# Patient Record
Sex: Male | Born: 1960
Health system: Southern US, Community
[De-identification: ages and names within clinical notes are randomized; demographics above are authoritative.]

## PROBLEM LIST (undated history)

## (undated) DIAGNOSIS — I10 Essential (primary) hypertension: Secondary | ICD-10-CM

## (undated) DIAGNOSIS — E079 Disorder of thyroid, unspecified: Secondary | ICD-10-CM

## (undated) DIAGNOSIS — E039 Hypothyroidism, unspecified: Secondary | ICD-10-CM

## (undated) DIAGNOSIS — G4730 Sleep apnea, unspecified: Secondary | ICD-10-CM

## (undated) DIAGNOSIS — D649 Anemia, unspecified: Secondary | ICD-10-CM

---

## 2004-07-18 DIAGNOSIS — G4733 Obstructive sleep apnea (adult) (pediatric): Secondary | ICD-10-CM | POA: Insufficient documentation

## 2005-04-29 ENCOUNTER — Ambulatory Visit (HOSPITAL_COMMUNITY): Admission: RE | Admit: 2005-04-29 | Discharge: 2005-04-29 | Payer: Self-pay | Admitting: Family Medicine

## 2006-04-14 ENCOUNTER — Ambulatory Visit (HOSPITAL_COMMUNITY): Admission: RE | Admit: 2006-04-14 | Discharge: 2006-04-14 | Payer: Self-pay | Admitting: Family Medicine

## 2006-05-08 ENCOUNTER — Ambulatory Visit: Payer: Self-pay | Admitting: Orthopedic Surgery

## 2006-12-26 ENCOUNTER — Ambulatory Visit (HOSPITAL_COMMUNITY): Admission: RE | Admit: 2006-12-26 | Discharge: 2006-12-26 | Payer: Self-pay | Admitting: Internal Medicine

## 2010-08-08 ENCOUNTER — Encounter: Payer: Self-pay | Admitting: Family Medicine

## 2010-09-15 ENCOUNTER — Emergency Department (HOSPITAL_COMMUNITY)
Admission: EM | Admit: 2010-09-15 | Discharge: 2010-09-15 | Disposition: A | Discharge status: Home / Self Care | Payer: Self-pay | Attending: Emergency Medicine | Admitting: Emergency Medicine

## 2010-09-15 DIAGNOSIS — S61209A Unspecified open wound of unspecified finger without damage to nail, initial encounter: Secondary | ICD-10-CM | POA: Insufficient documentation

## 2010-09-15 DIAGNOSIS — W278XXA Contact with other nonpowered hand tool, initial encounter: Secondary | ICD-10-CM | POA: Insufficient documentation

## 2010-09-15 DIAGNOSIS — E039 Hypothyroidism, unspecified: Secondary | ICD-10-CM | POA: Insufficient documentation

## 2016-07-29 DIAGNOSIS — E039 Hypothyroidism, unspecified: Secondary | ICD-10-CM | POA: Diagnosis not present

## 2016-07-29 DIAGNOSIS — Z1389 Encounter for screening for other disorder: Secondary | ICD-10-CM | POA: Diagnosis not present

## 2016-07-29 DIAGNOSIS — Z23 Encounter for immunization: Secondary | ICD-10-CM | POA: Diagnosis not present

## 2016-07-29 DIAGNOSIS — G4730 Sleep apnea, unspecified: Secondary | ICD-10-CM | POA: Diagnosis not present

## 2017-07-28 DIAGNOSIS — R2232 Localized swelling, mass and lump, left upper limb: Secondary | ICD-10-CM | POA: Diagnosis not present

## 2017-07-28 DIAGNOSIS — E039 Hypothyroidism, unspecified: Secondary | ICD-10-CM | POA: Diagnosis not present

## 2017-08-17 ENCOUNTER — Ambulatory Visit: Payer: Commercial Managed Care - PPO | Admitting: Orthopaedic Surgery

## 2017-08-17 ENCOUNTER — Encounter: Payer: Self-pay | Admitting: Orthopaedic Surgery

## 2017-08-17 VITALS — BP 136/88 | HR 81 | Temp 97.7°F | Wt 204.0 lb

## 2017-08-17 DIAGNOSIS — M6530 Trigger finger, unspecified finger: Secondary | ICD-10-CM

## 2017-08-17 NOTE — Progress Notes (Signed)
Subjective:    Patient ID: Jesse Roberts, male    DOB: 08/28/1960, 57 y.o.   MRN: [id number]  HPI He has had triggering and locking of his left long finger since November.  He lives in [place] [employer] [employer].  He is "back home" for a brief time.  He has problems with inability to fully flex his finger and he has noticed it will not fully straighten.  He is going back to [place] soon and cannot have any surgery on it now.  He will be back in April and will consider surgery then.  He has no redness, he has more pain at the PIP joint.  He has no trauma.   Review of Systems  Musculoskeletal: Positive for arthralgias.  All other systems reviewed and are negative.  History reviewed. No pertinent past medical history.  History reviewed. No pertinent surgical history.  No current outpatient medications on file prior to visit.   No current facility-administered medications on file prior to visit.     Social History   Socioeconomic History  . Marital status: Married    Spouse name: Not on file  . Number of children: Not on file  . Years of education: Not on file  . Highest education level: Not on file  Social Needs  . Financial resource strain: Not on file  . Food insecurity - worry: Not on file  . Food insecurity - inability: Not on file  . Transportation needs - medical: Not on file  . Transportation needs - non-medical: Not on file  Occupational History  . Not on file  Tobacco Use  . Smoking status: Never Smoker  . Smokeless tobacco: Never Used  Substance and Sexual Activity  . Alcohol use: Yes  . Drug use: No  . Sexual activity: Not on file  Other Topics Concern  . Not on file  Social History Narrative  . Not on file    History reviewed. No pertinent family history.  BP 136/88   Pulse 81   Temp 97.7 F (36.5 C)   Wt 204 lb (92.5 kg)       Objective:   Physical Exam  Constitutional: He is oriented to person, place, and time. He appears well-developed and  well-nourished.  HENT:  Head: Normocephalic and atraumatic.  Eyes: Conjunctivae and EOM are normal. Pupils are equal, round, and reactive to light.  Neck: Normal range of motion. Neck supple.  Cardiovascular: Normal rate, regular rhythm and intact distal pulses.  Pulmonary/Chest: Effort normal.  Abdominal: Soft.  Musculoskeletal: He exhibits tenderness (Left long finger cannot fully flex, if he does it locks up and hurts.  NV intact.  He cannot fully extend it without pain.  Other fingers negative.).  Neurological: He is alert and oriented to person, place, and time. He has normal reflexes. He displays normal reflexes. No cranial nerve deficit. He exhibits normal muscle tone. Coordination normal.  Skin: Skin is warm and dry.  Psychiatric: He has a normal mood and affect. His behavior is normal. Judgment and thought content normal.  Vitals reviewed.         Assessment & Plan:   Encounter Diagnosis  Name Primary?  . Trigger finger, acquired Yes   I have explained what he has and that surgery would be the definite treatment.  He is going back to [place] soon and says he cannot have surgery at this time.  I told him about injection that may or may not help.  He would  like to have the injection.  He will be back in April and will have surgery then if he is still having a problem.  Procedure note: After permission from the patient and prep of the palmar area of the A1 pulley of the left long finger, the area was injected by sterile technique with 1% xylocaine and 1 cc of DepoMedrol 40 tolerated well.  Return in April. Make appointment to see Dr. Romeo Apple for possible surgery.  Call if any problem.  Precautions discussed.   Electronically Signed Darreld Mclean, MD 1/31/20199:28 AM

## 2017-11-12 ENCOUNTER — Encounter (HOSPITAL_COMMUNITY): Payer: Self-pay | Admitting: Emergency Medicine

## 2017-11-12 ENCOUNTER — Emergency Department (HOSPITAL_COMMUNITY): Payer: Commercial Managed Care - PPO

## 2017-11-12 ENCOUNTER — Emergency Department (HOSPITAL_COMMUNITY)
Admission: EM | Admit: 2017-11-12 | Discharge: 2017-11-12 | Disposition: A | Discharge status: Home / Self Care | Payer: Commercial Managed Care - PPO | Attending: Emergency Medicine | Admitting: Emergency Medicine

## 2017-11-12 ENCOUNTER — Other Ambulatory Visit: Payer: Self-pay

## 2017-11-12 DIAGNOSIS — E039 Hypothyroidism, unspecified: Secondary | ICD-10-CM | POA: Insufficient documentation

## 2017-11-12 DIAGNOSIS — Z79899 Other long term (current) drug therapy: Secondary | ICD-10-CM | POA: Diagnosis not present

## 2017-11-12 DIAGNOSIS — L03114 Cellulitis of left upper limb: Secondary | ICD-10-CM | POA: Insufficient documentation

## 2017-11-12 DIAGNOSIS — R21 Rash and other nonspecific skin eruption: Secondary | ICD-10-CM | POA: Diagnosis not present

## 2017-11-12 DIAGNOSIS — S61432A Puncture wound without foreign body of left hand, initial encounter: Secondary | ICD-10-CM | POA: Diagnosis not present

## 2017-11-12 DIAGNOSIS — R2232 Localized swelling, mass and lump, left upper limb: Secondary | ICD-10-CM | POA: Diagnosis present

## 2017-11-12 HISTORY — DX: Disorder of thyroid, unspecified: E07.9

## 2017-11-12 MED ORDER — IBUPROFEN 600 MG PO TABS: 600.0000 mg | ORAL_TABLET | Freq: Four times a day (QID) | ORAL | 0 refills | Status: DC | PRN

## 2017-11-12 MED ORDER — CEPHALEXIN 500 MG PO CAPS: 500.0000 mg | ORAL_CAPSULE | Freq: Four times a day (QID) | ORAL | 0 refills | Status: DC

## 2017-11-12 MED ORDER — CEPHALEXIN 500 MG PO CAPS
500.0000 mg | ORAL_CAPSULE | Freq: Once | ORAL | Status: AC
  Administered 2017-11-12: 500 mg via ORAL
  Filled 2017-11-12: qty 1

## 2017-11-12 NOTE — ED Provider Notes (Signed)
[facility] EMERGENCY DEPARTMENT Provider Note   CSN: [id number] Arrival date & time: 11/12/17  1759     History   Chief Complaint Chief Complaint  Patient presents with  . Hand Injury    HPI Jesse Roberts is a 57 y.o. male.  HPI  Healthy 57 year old male, currently in the military, presents 24 hours after accidentally injuring his left hand.  He was using a cordless drill to build a flower bed, the drill bit slipped off of the surface and went through his left hand, penetrating the skin.  This occurred on the dorsum of the hand over the distal second metacarpal.  There was a small amount of bleeding.  He immediately went in the house and washed it off and applied a triple antibiotic ointment.  This morning he woke up and it was more red and swollen which has become progressively more swollen throughout the day.  Denies fevers, is still able to open and close the hand but with some discomfort.  Symptoms are persistent, mild to moderate, gradually worsening, no associated fevers  Past Medical History:  Diagnosis Date  . Thyroid disease    hypothyroidism    There are no active problems to display for this patient.   History reviewed. No pertinent surgical history.      Home Medications    Prior to Admission medications   Medication Sig Start Date End Date Taking? Authorizing Provider  calcium-vitamin D (OSCAL WITH D) 500-200 MG-UNIT tablet Take 1 tablet by mouth daily.   Yes [provider]  ferrous sulfate 325 (65 FE) MG tablet Take 325 mg by mouth daily with breakfast.   Yes [provider]  levothyroxine (SYNTHROID, LEVOTHROID) 75 MCG tablet Take 75 mcg by mouth daily before breakfast.   Yes [provider]  MAGNESIUM PO Take 1 tablet by mouth daily.   Yes [provider]  OVER THE COUNTER MEDICATION Take 2 capsules by mouth daily.   Yes [provider]  Potassium 99 MG TABS Take 1 tablet by mouth daily.   Yes [provider]  cephALEXin (KEFLEX) 500 MG capsule Take 1 capsule (500 mg total) by mouth 4 (four) times daily. 11/12/17   Eber Hong, MD  ibuprofen (ADVIL,MOTRIN) 600 MG tablet Take 1 tablet (600 mg total) by mouth every 6 (six) hours as needed. 11/12/17   Eber Hong, MD  sildenafil (VIAGRA) 25 MG tablet Take 25 mg by mouth daily as needed for erectile dysfunction.    [provider]    Family History No family history on file.  Social History Social History   Tobacco Use  . Smoking status: Never Smoker  . Smokeless tobacco: Never Used  Substance Use Topics  . Alcohol use: Yes    Comment: socially  . Drug use: No     Allergies   Patient has no known allergies.   Review of Systems Review of Systems  Constitutional: Negative for fever.  Skin: Positive for rash and wound.     Physical Exam Updated Vital Signs BP 137/85 (BP Location: Right Arm)   Pulse (!) 105   Temp 98.3 F (36.8 C) (Oral)   Resp 16   Ht  (1.702 m)   Wt 88.5 kg (195 lb)   SpO2 99%   BMI 30.54 kg/m   Physical Exam  Constitutional: He appears well-developed and well-nourished. No distress.  HENT:  Head: Normocephalic and atraumatic.  Eyes: Conjunctivae are normal. Right eye exhibits no discharge.  Left eye exhibits no discharge. No scleral icterus.  Cardiovascular: Normal rate and regular rhythm.  No murmur heard. Pulmonary/Chest: Effort normal and breath sounds normal.  Musculoskeletal: He exhibits tenderness. He exhibits no edema.  Patient is able to fully open and close the hand, flexion and extension of all fingers without any difficulty, no signs of extensor or flexor tenosynovitis.  Mild tenderness over the dorsum of the hand with mild redness extending to the wrist.  Skin: Skin is warm and dry. He is not diaphoretic.  Mild tenderness over the dorsum of the left hand, there is a small wound to the distal hand over the dorsal surface over the second metacarpal.  Scab in place   Nursing note and vitals reviewed.    ED Treatments / Results  Labs (all labs ordered are listed, but only abnormal results are displayed) Labs Reviewed - No data to display  EKG None  Radiology No results found.  Procedures Procedures (including critical care time)  Medications Ordered in ED Medications  cephALEXin (KEFLEX) capsule 500 mg (has no administration in time range)     Initial Impression / Assessment and Plan / ED Course  I have reviewed the triage vital signs and the nursing notes.  Pertinent labs & imaging results that were available during my care of the patient were reviewed by me and considered in my medical decision making (see chart for details).     X-ray to rule out foreign body or fracture of the bone, patient otherwise appears well, no need for surgery at this time, will proceed with antibiotics, the patient [place], I think this would be reasonable at this time, he is aware of the indications for return and will have access to surgical care as needed.  Keflex, Hand xrays without acute findings Pt informed Stable for d/c  Final Clinical Impressions(s) / ED Diagnoses   Final diagnoses:  Cellulitis of left hand    ED Discharge Orders        Ordered    cephALEXin (KEFLEX) 500 MG capsule  4 times daily     11/12/17 2022    ibuprofen (ADVIL,MOTRIN) 600 MG tablet  Every 6 hours PRN     11/12/17 2022       Eber Hong, MD 11/12/17 2024

## 2017-11-12 NOTE — ED Triage Notes (Signed)
Pt reports accidentally drilling into LT hand yesterday. Pt stated today he noticed increased pain, redness, and swelling.

## 2017-11-12 NOTE — Discharge Instructions (Addendum)
Your testing shows no injury to the bone There is a spreading infection called "cellulitis" which requires antibiotics Keflex 4 times daily for 7 days Emergency department immediately for increasing pain swelling or redness though you will have more swelling for the next 24 hours, it should then start to improve.  Ibuprofen for pain, keep the hand elevated  Keep the hand covered with sterile dressing and antibiotic ointment

## 2017-11-12 NOTE — ED Notes (Signed)
Pt ambulatory to waiting room. Pt verbalized understanding of discharge instructions.   

## 2017-11-13 DIAGNOSIS — Z0000 Encounter for general adult medical examination without abnormal findings: Secondary | ICD-10-CM | POA: Diagnosis not present

## 2017-11-13 DIAGNOSIS — E039 Hypothyroidism, unspecified: Secondary | ICD-10-CM | POA: Diagnosis not present

## 2018-03-28 ENCOUNTER — Ambulatory Visit: Payer: Self-pay | Admitting: Orthopaedic Surgery

## 2018-04-12 DIAGNOSIS — E039 Hypothyroidism, unspecified: Secondary | ICD-10-CM | POA: Diagnosis not present

## 2018-04-12 DIAGNOSIS — Z6831 Body mass index (BMI) 31.0-31.9, adult: Secondary | ICD-10-CM | POA: Diagnosis not present

## 2018-04-18 ENCOUNTER — Ambulatory Visit (INDEPENDENT_AMBULATORY_CARE_PROVIDER_SITE_OTHER): Payer: Commercial Managed Care - PPO | Admitting: Orthopaedic Surgery

## 2018-04-18 ENCOUNTER — Encounter: Payer: Self-pay | Admitting: Orthopaedic Surgery

## 2018-04-18 VITALS — BP 137/100 | HR 91 | Ht 67.0 in | Wt 200.0 lb

## 2018-04-18 DIAGNOSIS — M65332 Trigger finger, left middle finger: Secondary | ICD-10-CM | POA: Diagnosis not present

## 2018-04-18 DIAGNOSIS — M6530 Trigger finger, unspecified finger: Secondary | ICD-10-CM

## 2018-04-18 NOTE — Progress Notes (Signed)
Patient Jesse Roberts, male DOB:12/04/1960, 57 y.o. AVW:[id number]  Chief Complaint  Patient presents with  . Hand Pain    Trigger finger left middle    HPI  Jesse Roberts is a 57 y.o. male who has recurrence of trigger finger left long.  He is in the [employer] and is about to go to [place].  He will be back here in the [place] in January.  He would like to have surgery then.  I will arrange this with Dr. Romeo Apple.   Body mass index is 31.32 kg/m.  ROS  Review of Systems  Musculoskeletal: Positive for arthralgias.  All other systems reviewed and are negative.   All other systems reviewed and are negative.  The following is a summary of the past history medically, past history surgically, known current medicines, social history and family history.  This information is gathered electronically by the computer from prior information and documentation.  I review this each visit and have found including this information at this point in the chart is beneficial and informative.    Past Medical History:  Diagnosis Date  . Thyroid disease    hypothyroidism    History reviewed. No pertinent surgical history.  History reviewed. No pertinent family history.  Social History Social History   Tobacco Use  . Smoking status: Never Smoker  . Smokeless tobacco: Never Used  Substance Use Topics  . Alcohol use: Yes    Comment: socially  . Drug use: No    No Known Allergies  Current Outpatient Medications  Medication Sig Dispense Refill  . calcium-vitamin D (OSCAL WITH D) 500-200 MG-UNIT tablet Take 1 tablet by mouth daily.    . cephALEXin (KEFLEX) 500 MG capsule Take 1 capsule (500 mg total) by mouth 4 (four) times daily. 28 capsule 0  . ferrous sulfate 325 (65 FE) MG tablet Take 325 mg by mouth daily with breakfast.    . ibuprofen (ADVIL,MOTRIN) 600 MG tablet Take 1 tablet (600 mg total) by mouth every 6 (six) hours as needed. 30 tablet 0  . levothyroxine (SYNTHROID, LEVOTHROID) 75 MCG  tablet Take 75 mcg by mouth daily before breakfast.    . MAGNESIUM PO Take 1 tablet by mouth daily.    Marland Kitchen OVER THE COUNTER MEDICATION Take 2 capsules by mouth daily.    . Potassium 99 MG TABS Take 1 tablet by mouth daily.    . sildenafil (VIAGRA) 25 MG tablet Take 25 mg by mouth daily as needed for erectile dysfunction.     No current facility-administered medications for this visit.      Physical Exam  Blood pressure (!) 137/100, pulse 91, height 5\' 7"  (1.702 m), weight 200 lb (90.7 kg).  Constitutional: overall normal hygiene, normal nutrition, well developed, normal grooming, normal body habitus. Assistive device:none  Musculoskeletal: gait and station Limp none, muscle tone and strength are normal, no tremors or atrophy is present.  .  Neurological: coordination overall normal.  Deep tendon reflex/nerve stretch intact.  Sensation normal.  Cranial nerves II-XII intact.   Skin:   Normal overall no scars, lesions, ulcers or rashes. No psoriasis.  Psychiatric: Alert and oriented x 3.  Recent memory intact, remote memory unclear.  Normal mood and affect. Well groomed.  Good eye contact.  Cardiovascular: overall no swelling, no varicosities, no edema bilaterally, normal temperatures of the legs and arms, no clubbing, cyanosis and good capillary refill.  Lymphatic: palpation is normal.  He has triggering of the left long finger.  NV intact. All other systems reviewed and are negative   The patient has been educated about the nature of the problem(s) and counseled on treatment options.  The patient appeared to understand what I have discussed and is in agreement with it.  Encounter Diagnosis  Name Primary?  . Trigger finger, acquired Yes    PLAN Call if any problems.  Precautions discussed.  Continue current medications.   Return to clinic in January 2020 to see Dr. Romeo Apple.   Electronically Signed Darreld Mclean, MD 10/2/20193:06 PM

## 2018-07-25 ENCOUNTER — Encounter: Payer: Self-pay | Admitting: Orthopedic Surgery

## 2018-07-25 ENCOUNTER — Ambulatory Visit (INDEPENDENT_AMBULATORY_CARE_PROVIDER_SITE_OTHER): Payer: 59 | Admitting: Orthopedic Surgery

## 2018-07-25 VITALS — BP 147/93 | HR 78 | Ht 67.0 in | Wt 200.0 lb

## 2018-07-25 DIAGNOSIS — M6530 Trigger finger, unspecified finger: Secondary | ICD-10-CM

## 2018-07-25 NOTE — Progress Notes (Signed)
PREOP CONSULT/REFERRAL INTRA-OFFICE FROM DR Gaylene Brooks   Chief Complaint  Patient presents with  . Hand Problem    trigger finger / discuss surgery left middle     58 year old male presents for evaluation of left long trigger finger possible surgical intervention  He has had some difficulties with his left long finger with catching locking and moderate to severe pain over the A1 pulley for approximately 6 months.  He received a cortisone injection with good relief for 4-1/2 to 5 months.  However, he is interested in having surgery to get permanent relief   Review of Systems  Constitutional: Negative for chills, fever and weight loss.  Respiratory: Negative for shortness of breath.   Cardiovascular: Negative for chest pain.  Neurological: Negative for tingling.  All other systems reviewed and are negative.    Past Medical History:  Diagnosis Date  . Thyroid disease    hypothyroidism    History reviewed. No pertinent surgical history.  Family History  Problem Relation Age of Onset  . Healthy Mother   . COPD Father    Social History   Tobacco Use  . Smoking status: Never Smoker  . Smokeless tobacco: Never Used  Substance Use Topics  . Alcohol use: Yes    Comment: socially  . Drug use: No    No Known Allergies   Current Meds  Medication Sig  . calcium-vitamin D (OSCAL WITH D) 500-200 MG-UNIT tablet Take 1 tablet by mouth daily.  . ferrous sulfate 325 (65 FE) MG tablet Take 325 mg by mouth daily with breakfast.  . ibuprofen (ADVIL,MOTRIN) 600 MG tablet Take 1 tablet (600 mg total) by mouth every 6 (six) hours as needed.  Marland Kitchen levothyroxine (SYNTHROID, LEVOTHROID) 75 MCG tablet Take 75 mcg by mouth daily before breakfast.  . MAGNESIUM PO Take 1 tablet by mouth daily.  Marland Kitchen OVER THE COUNTER MEDICATION Take 2 capsules by mouth daily.  . Potassium 99 MG TABS Take 1 tablet by mouth daily.  . sildenafil (VIAGRA) 25 MG tablet Take 25 mg by mouth daily as needed for erectile  dysfunction.    BP (!) 147/93   Pulse 78   Ht 5\' 7"  (1.702 m)   Wt 200 lb (90.7 kg)   BMI 31.32 kg/m   Physical Exam Constitutional:      Appearance: He is well-developed.  Neurological:     Mental Status: He is alert and oriented to person, place, and time.  Psychiatric:        Behavior: Behavior normal.        Thought Content: Thought content normal.        Judgment: Judgment normal.     Ortho Exam   Left hand normal alignment is noted in the left hand.  There is tenderness over the A1 pulley and adequate and inability to fully flex the long finger no instability is elicited strength is normal in the flexor extensor tendons the skin is intact pulses are good color is normal lymph nodes in the epitrochlear region are normal there are no sensory deficits  The right hand appearance range of motion normal  MEDICAL DECISION SECTION  Dr. Sanjuan Dame note was reviewed his note confirms history given by the patient and previous injection No diagnosis found.   PLAN:   Surgical procedure planned: Left long finger trigger release  The procedure has been fully reviewed with the patient; The risks and benefits of surgery have been discussed and explained and understood. Alternative treatment has also been  reviewed, questions were encouraged and answered. The postoperative plan is also been reviewed.  Nonsurgical treatment as described in the history and physical section was attempted and unsuccessful and the patient has agreed to proceed with surgical intervention to improve their situation.  No orders of the defined types were placed in this encounter.   Fuller Canada, MD 07/25/2018 9:10 AM

## 2018-07-25 NOTE — Patient Instructions (Signed)
Trigger Finger    Trigger finger (stenosing tenosynovitis) is a condition that causes a finger to get stuck in a bent position. Each finger has a tough, cord-like tissue that connects muscle to bone (tendon), and each tendon is surrounded by a tunnel of tissue (tendon sheath). To move your finger, your tendon needs to slide freely through the sheath. Trigger finger happens when the tendon or the sheath thickens, making it difficult to move your finger.  Trigger finger can affect any finger or a thumb. It may affect more than one finger. Mild cases may clear up with rest and medicine. Severe cases require more treatment.  What are the causes?  Trigger finger is caused by a thickened finger tendon or tendon sheath. The cause of this thickening is not known.  What increases the risk?  The following factors may make you more likely to develop this condition:   Doing activities that require a strong grip.   Having rheumatoid arthritis, gout, or diabetes.   Being 40-60 years old.   Being a woman.  What are the signs or symptoms?  Symptoms of this condition include:   Pain when bending or straightening your finger.   Tenderness or swelling where your finger attaches to the palm of your hand.   A lump in the palm of your hand or on the inside of your finger.   Hearing a popping sound when you try to straighten your finger.   Feeling a popping, catching, or locking sensation when you try to straighten your finger.   Being unable to straighten your finger.  How is this diagnosed?  This condition is diagnosed based on your symptoms and a physical exam.  How is this treated?  This condition may be treated by:   Resting your finger and avoiding activities that make symptoms worse.   Wearing a finger splint to keep your finger in a slightly bent position.   Taking NSAIDs to relieve pain and swelling.   Injecting medicine (steroids) into the tendon sheath to reduce swelling and irritation. Injections may need to be  repeated.   Having surgery to open the tendon sheath. This may be done if other treatments do not work and you cannot straighten your finger. You may need physical therapy after surgery.  Follow these instructions at home:     Use moist heat to help reduce pain and swelling as told by your health care provider.   Rest your finger and avoid activities that make pain worse. Return to normal activities as told by your health care provider.   If you have a splint, wear it as told by your health care provider.   Take over-the-counter and prescription medicines only as told by your health care provider.   Keep all follow-up visits as told by your health care provider. This is important.  Contact a health care provider if:   Your symptoms are not improving with home care.  Summary   Trigger finger (stenosing tenosynovitis) causes your finger to get stuck in a bent position, and it can make it difficult and painful to straighten your finger.   This condition develops when a finger tendon or tendon sheath thickens.   Treatment starts with resting, wearing a splint, and taking NSAIDs.   In severe cases, surgery to open the tendon sheath may be needed.  This information is not intended to replace advice given to you by your health care provider. Make sure you discuss any questions you have with your   health care provider.  Document Released: 04/23/2004 Document Revised: 06/14/2016 Document Reviewed: 06/14/2016  Elsevier Interactive Patient Education  2019 [employer]

## 2018-07-26 ENCOUNTER — Telehealth: Payer: Self-pay | Admitting: Radiology

## 2018-07-26 NOTE — Telephone Encounter (Signed)
Called [employer] [facility] [phone] spoke to Destiny B no prior authorization is required for OP Trigger finger release [id number] Ref number for the call is her name and todays date

## 2018-07-30 NOTE — Patient Instructions (Signed)
Jake BatheRalph L Washinton  07/30/2018     @PREFPERIOPPHARMACY @   Your procedure is scheduled on  08/02/2018  Report to [facility] at  1000   A.M.  Call this number if you have problems the morning of surgery:  [phone]   Remember:  Do not eat or drink after midnight.                        Take these medicines the morning of surgery with A SIP OF WATER  Levothyroxine.    Do not wear jewelry, make-up or nail polish.  Do not wear lotions, powders, or perfumes, or deodorant.  Do not shave 48 hours prior to surgery.  Men may shave face and neck.  Do not bring valuables to the hospital.  [facility] is not responsible for any belongings or valuables.  Contacts, dentures or bridgework may not be worn into surgery.  Leave your suitcase in the car.  After surgery it may be brought to your room.  For patients admitted to the hospital, discharge time will be determined by your treatment team.  Patients discharged the day of surgery will not be allowed to drive home.   Name and phone number of your driver:   family Special instructions:  None  Please read over the following fact sheets that you were given. Anesthesia Post-op Instructions and Care and Recovery After Surgery       Incision Care, Adult An incision is a surgical cut that is made through your skin. Most incisions are closed after surgery. Your incision may be closed with stitches (sutures), staples, skin glue, or adhesive strips. You may need to return to your health care provider to have sutures or staples removed. This may occur several days to several weeks after your surgery. The incision needs to be cared for properly to prevent infection. How to care for your incision Incision care   Follow instructions from your health care provider about how to take care of your incision. Make sure you: ? Wash your hands with soap and water before you change the bandage (dressing). If soap and water are not available,  use hand sanitizer. ? Change your dressing as told by your health care provider. ? Leave sutures, skin glue, or adhesive strips in place. These skin closures may need to stay in place for 2 weeks or longer. If adhesive strip edges start to loosen and curl up, you may trim the loose edges. Do not remove adhesive strips completely unless your health care provider tells you to do that.  Check your incision area every day for signs of infection. Check for: ? More redness, swelling, or pain. ? More fluid or blood. ? Warmth. ? Pus or a bad smell.  Ask your health care provider how to clean the incision. This may include: ? Using mild soap and water. ? Using a clean towel to pat the incision dry after cleaning it. ? Applying a cream or ointment. Do this only as told by your health care provider. ? Covering the incision with a clean dressing.  Ask your health care provider when you can leave the incision uncovered.  Do not take baths, swim, or use a hot tub until your health care provider approves. Ask your health care provider if you can take showers. You may only be allowed to take sponge baths for bathing. Medicines  If you were prescribed an antibiotic  medicine, cream, or ointment, take or apply the antibiotic as told by your health care provider. Do not stop taking or applying the antibiotic even if your condition improves.  Take over-the-counter and prescription medicines only as told by your health care provider. General instructions  Limit movement around your incision to improve healing. ? Avoid straining, lifting, or exercise for the first month, or for as long as told by your health care provider. ? Follow instructions from your health care provider about returning to your normal activities. ? Ask your health care provider what activities are safe.  Protect your incision from the sun when you are outside for the first 6 months, or for as long as told by your health care provider.  Apply sunscreen around the scar or cover it up.  Keep all follow-up visits as told by your health care provider. This is important. Contact a health care provider if:  Your have more redness, swelling, or pain around the incision.  You have more fluid or blood coming from the incision.  Your incision feels warm to the touch.  You have pus or a bad smell coming from the incision.  You have a fever or shaking chills.  You are nauseous or you vomit.  You are dizzy.  Your sutures or staples come undone. Get help right away if:  You have a red streak coming from your incision.  Your incision bleeds through the dressing and the bleeding does not stop with gentle pressure.  The edges of your incision open up and separate.  You have severe pain.  You have a rash.  You are confused.  You faint.  You have trouble breathing and a fast heartbeat. This information is not intended to replace advice given to you by your health care provider. Make sure you discuss any questions you have with your health care provider. Document Released: 01/21/2005 Document Revised: 03/11/2016 Document Reviewed: 01/20/2016 Elsevier Interactive Patient Education  2019 [employer] Inc[employer]  Trigger Finger  Trigger finger (stenosing tenosynovitis) is a condition that causes a finger to get stuck in a bent position. Each finger has a tough, cord-like tissue that connects muscle to bone (tendon), and each tendon is surrounded by a tunnel of tissue (tendon sheath). To move your finger, your tendon needs to slide freely through the sheath. Trigger finger happens when the tendon or the sheath thickens, making it difficult to move your finger. Trigger finger can affect any finger or a thumb. It may affect more than one finger. Mild cases may clear up with rest and medicine. Severe cases require more treatment. What are the causes? Trigger finger is caused by a thickened finger tendon or tendon sheath. The cause of this  thickening is not known. What increases the risk? The following factors may make you more likely to develop this condition:  Doing activities that require a strong grip.  Having rheumatoid arthritis, gout, or diabetes.  Being 1840-228 years old.  Being a woman. What are the signs or symptoms? Symptoms of this condition include:  Pain when bending or straightening your finger.  Tenderness or swelling where your finger attaches to the palm of your hand.  A lump in the palm of your hand or on the inside of your finger.  Hearing a popping sound when you try to straighten your finger.  Feeling a popping, catching, or locking sensation when you try to straighten your finger.  Being unable to straighten your finger. How is this diagnosed? This condition is  diagnosed based on your symptoms and a physical exam. How is this treated? This condition may be treated by:  Resting your finger and avoiding activities that make symptoms worse.  Wearing a finger splint to keep your finger in a slightly bent position.  Taking NSAIDs to relieve pain and swelling.  Injecting medicine (steroids) into the tendon sheath to reduce swelling and irritation. Injections may need to be repeated.  Having surgery to open the tendon sheath. This may be done if other treatments do not work and you cannot straighten your finger. You may need physical therapy after surgery. Follow these instructions at home:   Use moist heat to help reduce pain and swelling as told by your health care provider.  Rest your finger and avoid activities that make pain worse. Return to normal activities as told by your health care provider.  If you have a splint, wear it as told by your health care provider.  Take over-the-counter and prescription medicines only as told by your health care provider.  Keep all follow-up visits as told by your health care provider. This is important. Contact a health care provider if:  Your  symptoms are not improving with home care. Summary  Trigger finger (stenosing tenosynovitis) causes your finger to get stuck in a bent position, and it can make it difficult and painful to straighten your finger.  This condition develops when a finger tendon or tendon sheath thickens.  Treatment starts with resting, wearing a splint, and taking NSAIDs.  In severe cases, surgery to open the tendon sheath may be needed. This information is not intended to replace advice given to you by your health care provider. Make sure you discuss any questions you have with your health care provider. Document Released: 04/23/2004 Document Revised: 06/14/2016 Document Reviewed: 06/14/2016 Elsevier Interactive Patient Education  2019 Elsevier Inc.  Monitored Anesthesia Care Anesthesia is a term that refers to techniques, procedures, and medicines that help a person stay safe and comfortable during a medical procedure. Monitored anesthesia care, or sedation, is one type of anesthesia. Your anesthesia specialist may recommend sedation if you will be having a procedure that does not require you to be unconscious, such as:  Cataract surgery.  A dental procedure.  A biopsy.  A colonoscopy. During the procedure, you may receive a medicine to help you relax (sedative). There are three levels of sedation:  Mild sedation. At this level, you may feel awake and relaxed. You will be able to follow directions.  Moderate sedation. At this level, you will be sleepy. You may not remember the procedure.  Deep sedation. At this level, you will be asleep. You will not remember the procedure. The more medicine you are given, the deeper your level of sedation will be. Depending on how you respond to the procedure, the anesthesia specialist may change your level of sedation or the type of anesthesia to fit your needs. An anesthesia specialist will monitor you closely during the procedure. Let your health care provider  know about:  Any allergies you have.  All medicines you are taking, including vitamins, herbs, eye drops, creams, and over-the-counter medicines.  Any use of steroids (by mouth or as a cream).  Any problems you or family members have had with sedatives and anesthetic medicines.  Any blood disorders you have.  Any surgeries you have had.  Any medical conditions you have, such as sleep apnea.  Whether you are pregnant or may be pregnant.  Any use of cigarettes, alcohol,  or street drugs. What are the risks? Generally, this is a safe procedure. However, problems may occur, including:  Getting too much medicine (oversedation).  Nausea.  Allergic reaction to medicines.  Trouble breathing. If this happens, a breathing tube may be used to help with breathing. It will be removed when you are awake and breathing on your own.  Heart trouble.  Lung trouble. Before the procedure Staying hydrated Follow instructions from your health care provider about hydration, which may include:  Up to 2 hours before the procedure - you may continue to drink clear liquids, such as water, clear fruit juice, black coffee, and plain tea. Eating and drinking restrictions Follow instructions from your health care provider about eating and drinking, which may include:  8 hours before the procedure - stop eating heavy meals or foods such as meat, fried foods, or fatty foods.  6 hours before the procedure - stop eating light meals or foods, such as toast or cereal.  6 hours before the procedure - stop drinking milk or drinks that contain milk.  2 hours before the procedure - stop drinking clear liquids. Medicines Ask your health care provider about:  Changing or stopping your regular medicines. This is especially important if you are taking diabetes medicines or blood thinners.  Taking medicines such as aspirin and ibuprofen. These medicines can thin your blood. Do not take these medicines before  your procedure if your health care provider instructs you not to. Tests and exams  You will have a physical exam.  You may have blood tests done to show: ? How well your kidneys and liver are working. ? How well your blood can clot. General instructions  Plan to have someone take you home from the hospital or clinic.  If you will be going home right after the procedure, plan to have someone with you for 24 hours.  What happens during the procedure?  Your blood pressure, heart rate, breathing, level of pain and overall condition will be monitored.  An IV tube will be inserted into one of your veins.  Your anesthesia specialist will give you medicines as needed to keep you comfortable during the procedure. This may mean changing the level of sedation.  The procedure will be performed. After the procedure  Your blood pressure, heart rate, breathing rate, and blood oxygen level will be monitored until the medicines you were given have worn off.  Do not drive for 24 hours if you received a sedative.  You may: ? Feel sleepy, clumsy, or nauseous. ? Feel forgetful about what happened after the procedure. ? Have a sore throat if you had a breathing tube during the procedure. ? Vomit. This information is not intended to replace advice given to you by your health care provider. Make sure you discuss any questions you have with your health care provider. Document Released: 03/30/2005 Document Revised: 12/11/2015 Document Reviewed: 10/25/2015 Elsevier Interactive Patient Education  2019 Else[employer] Inc. Monitored Anesthesia Care, Care After These instructions provide you with information about caring for yourself after your procedure. Your health care provider may also give you more specific instructions. Your treatment has been planned according to current medical practices, but problems sometimes occur. Call your health care provider if you have any problems or questions after your  procedure. What can I expect after the procedure? After your procedure, you may:  Feel sleepy for several hours.  Feel clumsy and have poor balance for several hours.  Feel forgetful about what happened after  the procedure.  Have poor judgment for several hours.  Feel nauseous or vomit.  Have a sore throat if you had a breathing tube during the procedure. Follow these instructions at home: For at least 24 hours after the procedure:      Have a responsible adult stay with you. It is important to have someone help care for you until you are awake and alert.  Rest as needed.  Do not: ? Participate in activities in which you could fall or become injured. ? Drive. ? Use heavy machinery. ? Drink alcohol. ? Take sleeping pills or medicines that cause drowsiness. ? Make important decisions or sign legal documents. ? Take care of children on your own. Eating and drinking  Follow the diet that is recommended by your health care provider.  If you vomit, drink water, juice, or soup when you can drink without vomiting.  Make sure you have little or no nausea before eating solid foods. General instructions  Take over-the-counter and prescription medicines only as told by your health care provider.  If you have sleep apnea, surgery and certain medicines can increase your risk for breathing problems. Follow instructions from your health care provider about wearing your sleep device: ? Anytime you are sleeping, including during daytime naps. ? While taking prescription pain medicines, sleeping medicines, or medicines that make you drowsy.  If you smoke, do not smoke without supervision.  Keep all follow-up visits as told by your health care provider. This is important. Contact a health care provider if:  You keep feeling nauseous or you keep vomiting.  You feel light-headed.  You develop a rash.  You have a fever. Get help right away if:  You have trouble  breathing. Summary  For several hours after your procedure, you may feel sleepy and have poor judgment.  Have a responsible adult stay with you for at least 24 hours or until you are awake and alert. This information is not intended to replace advice given to you by your health care provider. Make sure you discuss any questions you have with your health care provider. Document Released: 10/25/2015 Document Revised: 02/17/2017 Document Reviewed: 10/25/2015 Elsevier Interactive Patient Education  2019 [employer].

## 2018-07-31 NOTE — H&P (Signed)
PREOP CONSULT/REFERRAL INTRA-OFFICE FROM DR Gaylene Brooks     Chief Complaint  Patient presents with  . Hand Problem      trigger finger / discuss surgery left middle       58 year old male presents for evaluation of left long trigger finger possible surgical intervention   He has had some difficulties with his left long finger with catching locking and moderate to severe pain over the A1 pulley for approximately 6 months.  He received a cortisone injection with good relief for 4-1/2 to 5 months.   However, he is interested in having surgery to get permanent relief     Review of Systems  Constitutional: Negative for chills, fever and weight loss.  Respiratory: Negative for shortness of breath.   Cardiovascular: Negative for chest pain.  Neurological: Negative for tingling.  All other systems reviewed and are negative.           Past Medical History:  Diagnosis Date  . Thyroid disease      hypothyroidism      History reviewed. No pertinent surgical history.        Family History  Problem Relation Age of Onset  . Healthy Mother    . COPD Father      Social History         Tobacco Use  . Smoking status: Never Smoker  . Smokeless tobacco: Never Used  Substance Use Topics  . Alcohol use: Yes      Comment: socially  . Drug use: No      No Known Allergies     Active Medications      Current Meds  Medication Sig  . calcium-vitamin D (OSCAL WITH D) 500-200 MG-UNIT tablet Take 1 tablet by mouth daily.  . ferrous sulfate 325 (65 FE) MG tablet Take 325 mg by mouth daily with breakfast.  . ibuprofen (ADVIL,MOTRIN) 600 MG tablet Take 1 tablet (600 mg total) by mouth every 6 (six) hours as needed.  Marland Kitchen levothyroxine (SYNTHROID, LEVOTHROID) 75 MCG tablet Take 75 mcg by mouth daily before breakfast.  . MAGNESIUM PO Take 1 tablet by mouth daily.  Marland Kitchen OVER THE COUNTER MEDICATION Take 2 capsules by mouth daily.  . Potassium 99 MG TABS Take 1 tablet by mouth daily.  .  sildenafil (VIAGRA) 25 MG tablet Take 25 mg by mouth daily as needed for erectile dysfunction.        BP (!) 147/93   Pulse 78   Ht 5\' 7"  (1.702 m)   Wt 200 lb (90.7 kg)   BMI 31.32 kg/m    Physical Exam Constitutional:      Appearance: He is well-developed.  Neurological:     Mental Status: He is alert and oriented to person, place, and time.  Psychiatric:        Behavior: Behavior normal.        Thought Content: Thought content normal.        Judgment: Judgment normal.        Ortho Exam    Left hand normal alignment is noted in the left hand.  There is tenderness over the A1 pulley and adequate and inability to fully flex the long finger no instability is elicited strength is normal in the flexor extensor tendons the skin is intact pulses are good color is normal lymph nodes in the epitrochlear region are normal there are no sensory deficits   The right hand appearance range of motion normal   MEDICAL DECISION SECTION  Dr. Sanjuan Dame note was reviewed his note confirms history given by the patient and previous injection No diagnosis found.     PLAN:    Surgical procedure planned: Left long finger trigger release   The procedure has been fully reviewed with the patient; The risks and benefits of surgery have been discussed and explained and understood. Alternative treatment has also been reviewed, questions were encouraged and answered. The postoperative plan is also been reviewed.   Nonsurgical treatment as described in the history and physical section was attempted and unsuccessful and the patient has agreed to proceed with surgical intervention to improve their situation.   No orders of the defined types were placed in this encounter.

## 2018-08-01 ENCOUNTER — Encounter (HOSPITAL_COMMUNITY)
Admission: RE | Admit: 2018-08-01 | Discharge: 2018-08-01 | Disposition: A | Discharge status: Home / Self Care | Payer: PRIVATE HEALTH INSURANCE | Source: Ambulatory Visit | Attending: Orthopedic Surgery | Admitting: Orthopedic Surgery

## 2018-08-01 ENCOUNTER — Other Ambulatory Visit: Payer: Self-pay

## 2018-08-01 ENCOUNTER — Encounter (HOSPITAL_COMMUNITY): Payer: Self-pay

## 2018-08-01 DIAGNOSIS — M65332 Trigger finger, left middle finger: Secondary | ICD-10-CM | POA: Diagnosis not present

## 2018-08-01 DIAGNOSIS — M65842 Other synovitis and tenosynovitis, left hand: Secondary | ICD-10-CM | POA: Diagnosis not present

## 2018-08-01 DIAGNOSIS — Z01812 Encounter for preprocedural laboratory examination: Secondary | ICD-10-CM | POA: Insufficient documentation

## 2018-08-01 DIAGNOSIS — E039 Hypothyroidism, unspecified: Secondary | ICD-10-CM | POA: Diagnosis not present

## 2018-08-01 DIAGNOSIS — Z79890 Hormone replacement therapy (postmenopausal): Secondary | ICD-10-CM | POA: Diagnosis not present

## 2018-08-01 DIAGNOSIS — D649 Anemia, unspecified: Secondary | ICD-10-CM | POA: Diagnosis not present

## 2018-08-01 DIAGNOSIS — G4730 Sleep apnea, unspecified: Secondary | ICD-10-CM | POA: Diagnosis not present

## 2018-08-01 DIAGNOSIS — Z79899 Other long term (current) drug therapy: Secondary | ICD-10-CM | POA: Diagnosis not present

## 2018-08-01 DIAGNOSIS — I10 Essential (primary) hypertension: Secondary | ICD-10-CM | POA: Diagnosis not present

## 2018-08-01 HISTORY — DX: Hypothyroidism, unspecified: E03.9

## 2018-08-01 HISTORY — DX: Anemia, unspecified: D64.9

## 2018-08-01 HISTORY — DX: Sleep apnea, unspecified: G47.30

## 2018-08-01 HISTORY — DX: Essential (primary) hypertension: I10

## 2018-08-01 LAB — CBC WITH DIFFERENTIAL/PLATELET
Abs Immature Granulocytes: 0.04 10*3/uL (ref 0.00–0.07)
BASOS PCT: 1 %
Basophils Absolute: 0.1 10*3/uL (ref 0.0–0.1)
Eosinophils Absolute: 0.5 10*3/uL (ref 0.0–0.5)
Eosinophils Relative: 6 %
HCT: 42.3 % (ref 39.0–52.0)
Hemoglobin: 14.1 g/dL (ref 13.0–17.0)
Immature Granulocytes: 1 %
Lymphocytes Relative: 25 %
Lymphs Abs: 2 10*3/uL (ref 0.7–4.0)
MCH: 30.7 pg (ref 26.0–34.0)
MCHC: 33.3 g/dL (ref 30.0–36.0)
MCV: 92.2 fL (ref 80.0–100.0)
Monocytes Absolute: 0.4 10*3/uL (ref 0.1–1.0)
Monocytes Relative: 6 %
Neutro Abs: 5 10*3/uL (ref 1.7–7.7)
Neutrophils Relative %: 61 %
Platelets: 342 10*3/uL (ref 150–400)
RBC: 4.59 MIL/uL (ref 4.22–5.81)
RDW: 12.1 % (ref 11.5–15.5)
WBC: 8 10*3/uL (ref 4.0–10.5)
nRBC: 0 % (ref 0.0–0.2)

## 2018-08-01 LAB — BASIC METABOLIC PANEL
Anion gap: 9 (ref 5–15)
BUN: 14 mg/dL (ref 6–20)
CO2: 24 mmol/L (ref 22–32)
Calcium: 9.1 mg/dL (ref 8.9–10.3)
Chloride: 103 mmol/L (ref 98–111)
Creatinine, Ser: 0.96 mg/dL (ref 0.61–1.24)
GFR calc Af Amer: >60 mL/min (ref >60)
GFR calc non Af Amer: >60 mL/min (ref >60)
Glucose, Bld: 97 mg/dL (ref 70–99)
Potassium: 3.8 mmol/L (ref 3.5–5.1)
Sodium: 136 mmol/L (ref 135–145)

## 2018-08-02 ENCOUNTER — Encounter (HOSPITAL_COMMUNITY)
Admission: RE | Disposition: A | Discharge status: Home / Self Care | Payer: Self-pay | Source: Home / Self Care | Attending: Orthopedic Surgery

## 2018-08-02 ENCOUNTER — Encounter (HOSPITAL_COMMUNITY): Payer: Self-pay | Admitting: Anesthesiology

## 2018-08-02 ENCOUNTER — Ambulatory Visit (HOSPITAL_COMMUNITY): Payer: PRIVATE HEALTH INSURANCE | Admitting: Anesthesiology

## 2018-08-02 ENCOUNTER — Ambulatory Visit (HOSPITAL_COMMUNITY)
Admission: RE | Admit: 2018-08-02 | Discharge: 2018-08-02 | Disposition: A | Discharge status: Home / Self Care | Payer: PRIVATE HEALTH INSURANCE | Attending: Orthopedic Surgery | Admitting: Orthopedic Surgery

## 2018-08-02 DIAGNOSIS — E039 Hypothyroidism, unspecified: Secondary | ICD-10-CM | POA: Insufficient documentation

## 2018-08-02 DIAGNOSIS — M65842 Other synovitis and tenosynovitis, left hand: Secondary | ICD-10-CM | POA: Insufficient documentation

## 2018-08-02 DIAGNOSIS — M65332 Trigger finger, left middle finger: Secondary | ICD-10-CM

## 2018-08-02 DIAGNOSIS — Z79899 Other long term (current) drug therapy: Secondary | ICD-10-CM | POA: Insufficient documentation

## 2018-08-02 DIAGNOSIS — D649 Anemia, unspecified: Secondary | ICD-10-CM | POA: Insufficient documentation

## 2018-08-02 DIAGNOSIS — I10 Essential (primary) hypertension: Secondary | ICD-10-CM | POA: Insufficient documentation

## 2018-08-02 DIAGNOSIS — G4730 Sleep apnea, unspecified: Secondary | ICD-10-CM | POA: Insufficient documentation

## 2018-08-02 DIAGNOSIS — Z79890 Hormone replacement therapy (postmenopausal): Secondary | ICD-10-CM | POA: Insufficient documentation

## 2018-08-02 HISTORY — PX: TRIGGER FINGER RELEASE: SHX641

## 2018-08-02 SURGERY — RELEASE, A1 PULLEY, FOR TRIGGER FINGER
Anesthesia: Regional | Laterality: Left

## 2018-08-02 MED ORDER — MIDAZOLAM HCL 2 MG/2ML IJ SOLN: 0.5000 mg | Freq: Once | INTRAMUSCULAR | Status: DC | PRN

## 2018-08-02 MED ORDER — CEFAZOLIN SODIUM-DEXTROSE 2-4 GM/100ML-% IV SOLN
INTRAVENOUS | Status: AC
  Filled 2018-08-02: qty 100

## 2018-08-02 MED ORDER — FENTANYL CITRATE (PF) 100 MCG/2ML IJ SOLN
INTRAMUSCULAR | Status: DC | PRN
  Administered 2018-08-02: 50 ug via INTRAVENOUS
  Administered 2018-08-02: 25 ug via INTRAVENOUS

## 2018-08-02 MED ORDER — BUPIVACAINE HCL (PF) 0.5 % IJ SOLN
INTRAMUSCULAR | Status: AC
  Filled 2018-08-02: qty 30

## 2018-08-02 MED ORDER — HYDROMORPHONE HCL 1 MG/ML IJ SOLN: 0.2500 mg | INTRAMUSCULAR | Status: DC | PRN

## 2018-08-02 MED ORDER — BUPIVACAINE HCL (PF) 0.5 % IJ SOLN
INTRAMUSCULAR | Status: DC | PRN
  Administered 2018-08-02: 10 mL

## 2018-08-02 MED ORDER — PROMETHAZINE HCL 25 MG/ML IJ SOLN: 6.2500 mg | INTRAMUSCULAR | Status: DC | PRN

## 2018-08-02 MED ORDER — ACETAMINOPHEN-CODEINE #3 300-30 MG PO TABS: 1.0000 | ORAL_TABLET | Freq: Four times a day (QID) | ORAL | 0 refills | Status: DC | PRN

## 2018-08-02 MED ORDER — CEFAZOLIN SODIUM-DEXTROSE 2-4 GM/100ML-% IV SOLN
2.0000 g | INTRAVENOUS | Status: AC
  Administered 2018-08-02 (×2): 2 g via INTRAVENOUS

## 2018-08-02 MED ORDER — LIDOCAINE HCL (PF) 0.5 % IJ SOLN
INTRAMUSCULAR | Status: DC | PRN
  Administered 2018-08-02: 50 mL via INTRAVENOUS

## 2018-08-02 MED ORDER — SODIUM CHLORIDE 0.9 % IR SOLN
Status: DC | PRN
  Administered 2018-08-02: 1000 mL

## 2018-08-02 MED ORDER — PROPOFOL 500 MG/50ML IV EMUL
INTRAVENOUS | Status: DC | PRN
  Administered 2018-08-02: 50 ug/kg/min via INTRAVENOUS

## 2018-08-02 MED ORDER — HYDROCODONE-ACETAMINOPHEN 7.5-325 MG PO TABS: 1.0000 | ORAL_TABLET | Freq: Once | ORAL | Status: DC | PRN

## 2018-08-02 MED ORDER — LACTATED RINGERS IV SOLN
INTRAVENOUS | Status: DC
  Administered 2018-08-02: 11:00:00 via INTRAVENOUS

## 2018-08-02 MED ORDER — CHLORHEXIDINE GLUCONATE 4 % EX LIQD: 60.0000 mL | Freq: Once | CUTANEOUS | Status: DC

## 2018-08-02 SURGICAL SUPPLY — 43 items
BANDAGE ELASTIC 2 LF NS (GAUZE/BANDAGES/DRESSINGS) ×3 IMPLANT
BANDAGE ESMARK 4X12 BL STRL LF (DISPOSABLE) ×1 IMPLANT
BLADE SURG 15 STRL LF DISP TIS (BLADE) ×1 IMPLANT
BLADE SURG 15 STRL SS (BLADE) ×3
BNDG CMPR 12X4 ELC STRL LF (DISPOSABLE) ×1
BNDG CMPR MED 5X2 ELC HKLP NS (GAUZE/BANDAGES/DRESSINGS) ×1
BNDG CONFORM 2 STRL LF (GAUZE/BANDAGES/DRESSINGS) ×3 IMPLANT
BNDG ESMARK 4X12 BLUE STRL LF (DISPOSABLE) ×3
CHLORAPREP W/TINT 26ML (MISCELLANEOUS) ×3 IMPLANT
CLOTH BEACON ORANGE TIMEOUT ST (SAFETY) ×3 IMPLANT
COVER LIGHT HANDLE STERIS (MISCELLANEOUS) ×6 IMPLANT
COVER WAND RF STERILE (DRAPES) ×2 IMPLANT
CUFF TOURNIQUET SINGLE 18IN (TOURNIQUET CUFF) ×3 IMPLANT
CUFF TOURNIQUET SINGLE 24IN (TOURNIQUET CUFF) IMPLANT
DECANTER SPIKE VIAL GLASS SM (MISCELLANEOUS) ×3 IMPLANT
DRAPE HALF SHEET 40X57 (DRAPES) ×3 IMPLANT
ELECT NDL TIP 2.8 STRL (NEEDLE) ×1 IMPLANT
ELECT NEEDLE TIP 2.8 STRL (NEEDLE) ×3 IMPLANT
ELECT REM PT RETURN 9FT ADLT (ELECTROSURGICAL) ×3
ELECTRODE REM PT RTRN 9FT ADLT (ELECTROSURGICAL) ×1 IMPLANT
GAUZE SPONGE 4X4 12PLY STRL (GAUZE/BANDAGES/DRESSINGS) ×3 IMPLANT
GAUZE XEROFORM 1X8 LF (GAUZE/BANDAGES/DRESSINGS) ×3 IMPLANT
GLOVE BIOGEL M 7.0 STRL (GLOVE) ×2 IMPLANT
GLOVE BIOGEL PI IND STRL 7.0 (GLOVE) ×1 IMPLANT
GLOVE BIOGEL PI INDICATOR 7.0 (GLOVE) ×4
GLOVE SKINSENSE NS SZ8.0 LF (GLOVE) ×2
GLOVE SKINSENSE STRL SZ8.0 LF (GLOVE) ×1 IMPLANT
GLOVE SS N UNI LF 8.5 STRL (GLOVE) ×3 IMPLANT
GOWN STRL REUS W/TWL LRG LVL3 (GOWN DISPOSABLE) ×3 IMPLANT
GOWN STRL REUS W/TWL XL LVL3 (GOWN DISPOSABLE) ×3 IMPLANT
KIT TURNOVER KIT A (KITS) ×3 IMPLANT
MANIFOLD NEPTUNE II (INSTRUMENTS) ×3 IMPLANT
NDL HYPO 21X1.5 SAFETY (NEEDLE) ×1 IMPLANT
NEEDLE HYPO 21X1.5 SAFETY (NEEDLE) ×3 IMPLANT
NS IRRIG 1000ML POUR BTL (IV SOLUTION) ×3 IMPLANT
PACK BASIC LIMB (CUSTOM PROCEDURE TRAY) ×3 IMPLANT
PAD ARMBOARD 7.5X6 YLW CONV (MISCELLANEOUS) ×3 IMPLANT
POSITIONER HAND ALUMI XLG (MISCELLANEOUS) ×3 IMPLANT
SET BASIN LINEN APH (SET/KITS/TRAYS/PACK) ×3 IMPLANT
SPONGE GAUZE 2X2 8PLY STER LF (GAUZE/BANDAGES/DRESSINGS) ×1
SPONGE GAUZE 2X2 8PLY STRL LF (GAUZE/BANDAGES/DRESSINGS) ×1 IMPLANT
SUT ETHILON 3 0 FSL (SUTURE) ×3 IMPLANT
SYR CONTROL 10ML LL (SYRINGE) ×3 IMPLANT

## 2018-08-02 NOTE — Discharge Instructions (Addendum)
Follow up on February 4th @ 9:30 with Dr. Romeo AppleHarrison  General Anesthesia, Adult, Care After This sheet gives you information about how to care for yourself after your procedure. Your health care provider may also give you more specific instructions. If you have problems or questions, contact your health care provider. What can I expect after the procedure? After the procedure, the following side effects are common:  Pain or discomfort at the IV site.  Nausea.  Vomiting.  Sore throat.  Trouble concentrating.  Feeling cold or chills.  Weak or tired.  Sleepiness and fatigue.  Soreness and body aches. These side effects can affect parts of the body that were not involved in surgery. Follow these instructions at home:  For at least 24 hours after the procedure:  Have a responsible adult stay with you. It is important to have someone help care for you until you are awake and alert.  Rest as needed.  Do not: ? Participate in activities in which you could fall or become injured. ? Drive. ? Use heavy machinery. ? Drink alcohol. ? Take sleeping pills or medicines that cause drowsiness. ? Make important decisions or sign legal documents. ? Take care of children on your own. Eating and drinking  Follow any instructions from your health care provider about eating or drinking restrictions.  When you feel hungry, start by eating small amounts of foods that are soft and easy to digest (bland), such as toast. Gradually return to your regular diet.  Drink enough fluid to keep your urine pale yellow.  If you vomit, rehydrate by drinking water, juice, or clear broth. General instructions  If you have sleep apnea, surgery and certain medicines can increase your risk for breathing problems. Follow instructions from your health care provider about wearing your sleep device: ? Anytime you are sleeping, including during daytime naps. ? While taking prescription pain medicines, sleeping  medicines, or medicines that make you drowsy.  Return to your normal activities as told by your health care provider. Ask your health care provider what activities are safe for you.  Take over-the-counter and prescription medicines only as told by your health care provider.  If you smoke, do not smoke without supervision.  Keep all follow-up visits as told by your health care provider. This is important. Contact a health care provider if:  You have nausea or vomiting that does not get better with medicine.  You cannot eat or drink without vomiting.  You have pain that does not get better with medicine.  You are unable to pass urine.  You develop a skin rash.  You have a fever.  You have redness around your IV site that gets worse. Get help right away if:  You have difficulty breathing.  You have chest pain.  You have blood in your urine or stool, or you vomit blood. Summary  After the procedure, it is common to have a sore throat or nausea. It is also common to feel tired.  Have a responsible adult stay with you for the first 24 hours after general anesthesia. It is important to have someone help care for you until you are awake and alert.  When you feel hungry, start by eating small amounts of foods that are soft and easy to digest (bland), such as toast. Gradually return to your regular diet.  Drink enough fluid to keep your urine pale yellow.  Return to your normal activities as told by your health care provider. Ask your health care  provider what activities are safe for you. This information is not intended to replace advice given to you by your health care provider. Make sure you discuss any questions you have with your health care provider. Document Released: 10/10/2000 Document Revised: 02/17/2017 Document Reviewed: 02/17/2017 Elsevier Interactive Patient Education  2019 [employer]

## 2018-08-02 NOTE — Anesthesia Preprocedure Evaluation (Signed)
Anesthesia Evaluation  Patient identified by MRN, date of birth, ID band Patient awake    Reviewed: Allergy & Precautions, NPO status , Patient's Chart, lab work & pertinent test results  Airway Mallampati: II  TM Distance: >3 FB Neck ROM: Full    Dental no notable dental hx. (+) Teeth Intact   Pulmonary sleep apnea and Continuous Positive Airway Pressure Ventilation ,    Pulmonary exam normal breath sounds clear to auscultation       Cardiovascular Exercise Tolerance: Good hypertension, Pt. on medications negative cardio ROS Normal cardiovascular examI Rhythm:Regular Rate:Normal     Neuro/Psych negative neurological ROS  negative psych ROS   GI/Hepatic negative GI ROS, Neg liver ROS,   Endo/Other  Hypothyroidism   Renal/GU negative Renal ROS  negative genitourinary   Musculoskeletal negative musculoskeletal ROS (+)   Abdominal   Peds negative pediatric ROS (+)  Hematology negative hematology ROS (+) anemia ,   Anesthesia Other Findings   Reproductive/Obstetrics negative OB ROS                             Anesthesia Physical Anesthesia Plan  ASA: II  Anesthesia Plan: Regional and Bier Block and Bier Block-LIDOCAINE ONLY   Post-op Pain Management:    Induction: Intravenous  PONV Risk Score and Plan:   Airway Management Planned: Nasal Cannula and Simple Face Mask  Additional Equipment:   Intra-op Plan:   Post-operative Plan:   Informed Consent: I have reviewed the patients History and Physical, chart, labs and discussed the procedure including the risks, benefits and alternatives for the proposed anesthesia with the patient or authorized representative who has indicated his/her understanding and acceptance.     Dental advisory given  Plan Discussed with: CRNA  Anesthesia Plan Comments:         Anesthesia Quick Evaluation

## 2018-08-02 NOTE — Interval H&P Note (Signed)
History and Physical Interval Note:  08/02/2018 10:34 AM  Jesse Roberts  has presented today for surgery, with the diagnosis of trigger finger left long  The various methods of treatment have been discussed with the patient and family. After consideration of risks, benefits and other options for treatment, the patient has consented to  Procedure(s): TRIGGER FINGER RELEASE LEFT LONG FINGER (Left) as a surgical intervention .  The patient's history has been reviewed, patient examined, no change in status, stable for surgery.  I have reviewed the patient's chart and labs.  Questions were answered to the patient's satisfaction.     Fuller CanadaStanley Perry Molla

## 2018-08-02 NOTE — Transfer of Care (Signed)
Immediate Anesthesia Transfer of Care Note  Patient: Jesse Roberts  Procedure(s) Performed: TRIGGER FINGER RELEASE LEFT LONG FINGER (Left )  Patient Location: PACU  Anesthesia Type:MAC and Bier block  Level of Consciousness: awake  Airway & Oxygen Therapy: Patient Spontanous Breathing  Post-op Assessment: Report given to RN  Post vital signs: Reviewed and stable  Last Vitals:  Vitals Value Taken Time  BP 126/89 08/02/2018 11:41 AM  Temp    Pulse 72 08/02/2018 11:46 AM  Resp 16 08/02/2018 11:46 AM  SpO2 96 % 08/02/2018 11:46 AM  Vitals shown include unvalidated device data.  Last Pain:  Vitals:   08/02/18 1016  TempSrc: Oral  PainSc: 0-No pain      Patients Stated Pain Goal: 5 (91/50/56 [id number])  Complications: No apparent anesthesia complications

## 2018-08-02 NOTE — Anesthesia Procedure Notes (Signed)
Anesthesia Regional Block: Bier block (IV Regional)   Pre-Anesthetic Checklist: ,, timeout performed, Correct Patient, Correct Site, Correct Laterality, Correct Procedure,, site marked, surgical consent,, at surgeon's request  Laterality: Left     Needles:  Injection technique: Single-shot  Needle Type: Other      Needle Gauge: 22     Additional Needles:   Procedures:,,,,, intact distal pulses, Esmarch exsanguination, single tourniquet utilized,   Nerve Stimulator or Paresthesia:   Additional Responses:  Pulse checked post tourniquet inflation. IV NSL discontinued post injection. Narrative:   Performed by: Personally       

## 2018-08-02 NOTE — Op Note (Signed)
08/02/2018  11:39 AM  PATIENT:  Jesse Roberts  58 y.o. male  PRE-OPERATIVE DIAGNOSIS:  trigger finger left long finger  POST-OPERATIVE DIAGNOSIS:  trigger finger left long finger  PROCEDURE:  Procedure(s): TRIGGER FINGER RELEASE LEFT LONG FINGER [phone]  OPERATIVE REPORT   08/02/2018 11:40 AM Fuller Canada, MD   Preop diagnosis trigger finger left long finger  Postop diagnosis same  Procedure release A1 pulley left long finger  Surgeon Romeo Apple Anesthesia Bier block Operation findings: Stenosing tenosynovitis flexor tendon A1 pulley No assistants Counts were correct Clean case no specimen 10 mL of Marcaine with epinephrine injected after the case Patient to recovery room patient's stable condition  The procedure was performed as follows  The patient was identified in the preop area and the surgical site was confirmed and marked, chart update was completed patient taken to surgery given appropriate antibiotics based on her allergy profile  After successful Bier block in sterile prep and drape timeout was completed  A transverse incision was made over the A1 pulley of the left long  finger subcutaneous tissue was divided blunt dissection was carried out to protect neurovascular structures. A blunt instrument was placed underneath the A1 pulley and the A1 pulley was released. Flexion extension of the digit confirmed removal of mechanical block. Wound was irrigated and closed with 3-0 nylon suture  We took the patient to recovery room in stable condition  Fuller Canada, MD   SURGEON:  Surgeon(s) and Role:    * Vickki Hearing, MD - Primary  PHYSICIAN ASSISTANT:   ASSISTANTS: none   ANESTHESIA:   regional  EBL:  0 mL   BLOOD ADMINISTERED:none  DRAINS: none   LOCAL MEDICATIONS USED:  MARCAINE     SPECIMEN:  No Specimen  DISPOSITION OF SPECIMEN:  N/A  COUNTS:  YES  TOURNIQUET:   Total Tourniquet Time Documented: Upper Arm (Left) - 28  minutes Total: Upper Arm (Left) - 28 minutes   DICTATION: .Reubin Milan Dictation  PLAN OF CARE: Discharge to home after PACU  PATIENT DISPOSITION:  PACU - hemodynamically stable.   Delay start of Pharmacological VTE agent (>24hrs) due to surgical blood loss or risk of bleeding: not applicable

## 2018-08-02 NOTE — Brief Op Note (Signed)
08/02/2018  11:39 AM  PATIENT:  Jesse Roberts  57 y.o. male  PRE-OPERATIVE DIAGNOSIS:  trigger finger left long finger  POST-OPERATIVE DIAGNOSIS:  trigger finger left long finger  PROCEDURE:  Procedure(s): TRIGGER FINGER RELEASE LEFT LONG FINGER (Left)-[id number]  OPERATIVE REPORT   08/02/2018 11:40 AM Stanley Harrison, MD   Preop diagnosis trigger finger left long finger  Postop diagnosis same  Procedure release A1 pulley left long finger  Surgeon Harrison Anesthesia Bier block Operation findings: Stenosing tenosynovitis flexor tendon A1 pulley No assistants Counts were correct Clean case no specimen 10 mL of Marcaine with epinephrine injected after the case Patient to recovery room patient's stable condition  The procedure was performed as follows  The patient was identified in the preop area and the surgical site was confirmed and marked, chart update was completed patient taken to surgery given appropriate antibiotics based on her allergy profile  After successful Bier block in sterile prep and drape timeout was completed  A transverse incision was made over the A1 pulley of the left long  finger subcutaneous tissue was divided blunt dissection was carried out to protect neurovascular structures. A blunt instrument was placed underneath the A1 pulley and the A1 pulley was released. Flexion extension of the digit confirmed removal of mechanical block. Wound was irrigated and closed with 3-0 nylon suture  We took the patient to recovery room in stable condition  Stanley Harrison, MD   SURGEON:  Surgeon(s) and Role:    * Harrison, Stanley E, MD - Primary  PHYSICIAN ASSISTANT:   ASSISTANTS: none   ANESTHESIA:   regional  EBL:  0 mL   BLOOD ADMINISTERED:none  DRAINS: none   LOCAL MEDICATIONS USED:  MARCAINE     SPECIMEN:  No Specimen  DISPOSITION OF SPECIMEN:  N/A  COUNTS:  YES  TOURNIQUET:   Total Tourniquet Time Documented: Upper Arm (Left) - 28  minutes Total: Upper Arm (Left) - 28 minutes   DICTATION: .Dragon Dictation  PLAN OF CARE: Discharge to home after PACU  PATIENT DISPOSITION:  PACU - hemodynamically stable.   Delay start of Pharmacological VTE agent (>24hrs) due to surgical blood loss or risk of bleeding: not applicable  

## 2018-08-02 NOTE — Anesthesia Postprocedure Evaluation (Signed)
Anesthesia Post Note  Patient: Jesse Roberts  Procedure(s) Performed: TRIGGER FINGER RELEASE LEFT LONG FINGER (Left )  Patient location during evaluation: Short Stay Anesthesia Type: Regional Level of consciousness: awake and alert and patient cooperative Pain management: satisfactory to patient Vital Signs Assessment: post-procedure vital signs reviewed and stable Respiratory status: spontaneous breathing Cardiovascular status: stable Postop Assessment: no apparent nausea or vomiting Anesthetic complications: no     Last Vitals:  Vitals:   08/02/18 1200 08/02/18 1208  BP: 127/89 (!) 143/91  Pulse: 64 (!) 59  Resp: 15   Temp:  36.4 C  SpO2: 98% 96%    Last Pain:  Vitals:   08/02/18 1208  TempSrc: Oral  PainSc: 0-No pain                 Opie Fanton

## 2018-08-02 NOTE — Interval H&P Note (Signed)
History and Physical Interval Note:  08/02/2018 10:37 AM  Jesse Roberts  has presented today for surgery, with the diagnosis of trigger finger left long  The various methods of treatment have been discussed with the patient and family. After consideration of risks, benefits and other options for treatment, the patient has consented to  Procedure(s): TRIGGER FINGER RELEASE LEFT LONG FINGER (Left) as a surgical intervention .  The patient's history has been reviewed, patient examined, no change in status, stable for surgery.  I have reviewed the patient's chart and labs.  Questions were answered to the patient's satisfaction.     Fuller Canada

## 2018-08-03 ENCOUNTER — Encounter (HOSPITAL_COMMUNITY): Payer: Self-pay | Admitting: Orthopedic Surgery

## 2018-08-17 ENCOUNTER — Ambulatory Visit (INDEPENDENT_AMBULATORY_CARE_PROVIDER_SITE_OTHER): Payer: 59 | Admitting: Orthopedic Surgery

## 2018-08-17 VITALS — BP 150/92 | HR 98 | Ht 67.0 in | Wt 200.0 lb

## 2018-08-17 DIAGNOSIS — M65332 Trigger finger, left middle finger: Secondary | ICD-10-CM

## 2018-08-17 NOTE — Progress Notes (Signed)
Postop  Chief Complaint  Patient presents with  . Follow-up    Recheck on left long trigger finger, DOS 08-02-18.   The patient comes in for his first postop visit and suture removal he has almost near full flexion and full extension of the digit the wound is clean the sutures were removed.  He is encouraging continue range of motion exercises until full range of motion is achieved

## 2018-08-21 ENCOUNTER — Ambulatory Visit: Payer: 59 | Admitting: Orthopedic Surgery

## 2018-09-17 ENCOUNTER — Telehealth: Payer: Self-pay

## 2018-09-17 NOTE — Telephone Encounter (Signed)
Rec'd from Dwana Melena MD forwarded 11 pages to GI Historical Provider

## 2018-09-27 ENCOUNTER — Telehealth: Payer: Self-pay

## 2018-09-27 NOTE — Telephone Encounter (Signed)
Pt called to schedule his TCS. Pt brought his referral letter in by hand 2 weeks ago and he would like a call back within 5 days. [place] and wants to schedule his procedure. Pt can be reached at [phone].

## 2018-09-28 NOTE — Telephone Encounter (Signed)
I tried to call the patient to make the him aware that we returned his referral back to Dr. Scharlene Gloss office when it was sent to [place], however I was unable to reach him.  We will not be able to get his tcs done before 3/31.

## 2018-10-03 NOTE — Telephone Encounter (Signed)
Jesse Roberts spoke with the pt and explained everything to him. He needs tcs before October for his job. We have him scheduled for a nurse visit on 01/07/19 at 8:00am and we have held a spot for him on 01/14/19 at 8:30am for his tcs.   Darl Pikes, I think referral was sent back to Dr.Hall's office. Can you get another referral from them please?

## 2018-10-03 NOTE — Telephone Encounter (Signed)
Tried to call pt- NA 

## 2018-10-12 NOTE — Telephone Encounter (Signed)
Referral from PCP is in the [place] box

## 2019-01-07 ENCOUNTER — Ambulatory Visit: Payer: 59

## 2019-01-25 DIAGNOSIS — E039 Hypothyroidism, unspecified: Secondary | ICD-10-CM | POA: Diagnosis not present

## 2019-01-25 DIAGNOSIS — I10 Essential (primary) hypertension: Secondary | ICD-10-CM | POA: Diagnosis not present

## 2019-01-28 DIAGNOSIS — M25562 Pain in left knee: Secondary | ICD-10-CM | POA: Diagnosis not present

## 2019-01-28 DIAGNOSIS — Z0001 Encounter for general adult medical examination with abnormal findings: Secondary | ICD-10-CM | POA: Diagnosis not present

## 2019-01-28 DIAGNOSIS — E039 Hypothyroidism, unspecified: Secondary | ICD-10-CM | POA: Diagnosis not present

## 2019-03-21 ENCOUNTER — Other Ambulatory Visit: Payer: Self-pay

## 2019-03-21 ENCOUNTER — Ambulatory Visit (INDEPENDENT_AMBULATORY_CARE_PROVIDER_SITE_OTHER): Payer: BC Managed Care – PPO | Admitting: *Deleted

## 2019-03-21 DIAGNOSIS — Z1211 Encounter for screening for malignant neoplasm of colon: Secondary | ICD-10-CM

## 2019-03-21 MED ORDER — PEG 3350-KCL-NA BICARB-NACL 420 G PO SOLR: 4000.0000 mL | Freq: Once | ORAL | 0 refills | Status: AC

## 2019-03-21 NOTE — Patient Instructions (Signed)
Jesse Roberts   1960-09-19 MRN: [id number]    Procedure Date: 03/29/2019 Time to register: 7:30 am  Place to register: [facility] Short [facility] Procedure Time: 8:30 am Scheduled provider: Dr. Oneida Alar  PREPARATION FOR COLONOSCOPY WITH TRI-LYTE SPLIT PREP  Please notify us immediately if you are diabetic, take iron supplements, or if you are on Coumadin or any other blood thinners.    You will need to purchase 1 fleet enema and 1 box of Bisacodyl 51m tablets.   1 DAY BEFORE PROCEDURE:  DATE: 03/28/2019   DAY: Thursday Continue clear liquids the entire day - NO SOLID FOOD.    At 2:00 pm:  Take 2 Bisacodyl tablets.   At 4:00pm:  Start drinking your solution. Make sure you mix well per instructions on the bottle. Try to drink 1 (one) 8 ounce glass every 10-15 minutes until you have consumed HALF the jug. You should complete by 6:00pm.You must keep the left over solution refrigerated until completed next day.  Continue clear liquids. You must drink plenty of clear liquids to prevent dehyration and kidney failure.     DAY OF PROCEDURE:   DATE: 03/29/2019  DAY: Friday If you take medications for your heart, blood pressure or breathing, you may take these medications.    Five hours before your procedure time @ 3:30 am:  Finish remaining amout of bowel prep, drinking 1 (one) 8 ounce glass every 10-15 minutes until complete. You have two hours to consume remaining prep.   Three hours before your procedure time @ 5:30 am:  Nothing by mouth.   At least one hour before going to the hospital:  Give yourself one Fleet enema. You may take your morning medications with sip of water unless we have instructed otherwise.      Please see below for Dietary Information.  CLEAR LIQUIDS INCLUDE:  Water Jello (NOT red in color)   Ice Popsicles (NOT red in color)   Tea (sugar ok, no milk/cream) Powdered fruit flavored drinks  Coffee (sugar ok, no milk/cream) Gatorade/ Lemonade/ Kool-Aid  (NOT red in  color)   Juice: apple, white grape, white cranberry Soft drinks  Clear bullion, consomme, broth (fat free beef/chicken/vegetable)  Carbonated beverages (any kind)  Strained chicken noodle soup Hard Candy   Remember: Clear liquids are liquids that will allow you to see your fingers on the other side of a clear glass. Be sure liquids are NOT red in color, and not cloudy, but CLEAR.  DO NOT EAT OR DRINK ANY OF THE FOLLOWING:  Dairy products of any kind   Cranberry juice Tomato juice / V8 juice   Grapefruit juice Orange juice     Red grape juice  Do not eat any solid foods, including such foods as: cereal, oatmeal, yogurt, fruits, vegetables, creamed soups, eggs, bread, crackers, pureed foods in a blender, etc.   HELPFUL HINTS FOR DRINKING PREP SOLUTION:   Make sure prep is extremely cold. Mix and refrigerate the the morning of the prep. You may also put in the freezer.   You may try mixing some Crystal Light or Country Time Lemonade if you prefer. Mix in small amounts; add more if necessary.  Try drinking through a straw  Rinse mouth with water or a mouthwash between glasses, to remove after-taste.  Try sipping on a cold beverage /ice/ popsicles between glasses of prep.  Place a piece of sugar-free hard candy in mouth between glasses.  If you become nauseated, try consuming smaller amounts, or  stretch out the time between glasses. Stop for 30-60 minutes, then slowly start back drinking.        OTHER INSTRUCTIONS  You will need a responsible adult at least 58 years of age to accompany you and drive you home. This person must remain in the waiting room during your procedure. The hospital will cancel your procedure if you do not have a responsible adult with you.   1. Wear loose fitting clothing that is easily removed. 2. Leave jewelry and other valuables at home.  3. Remove all body piercing jewelry and leave at home. 4. Total time from sign-in until discharge is approximately  2-3 hours. 5. You should go home directly after your procedure and rest. You can resume normal activities the day after your procedure. 6. The day of your procedure you should not:  Drive  Make legal decisions  Operate machinery  Drink alcohol  Return to work   You may call the office (Dept: [phone]) before 5:00pm, or page the doctor on call [phone]) after 5:00pm, for further instructions, if necessary.   Insurance Information YOU WILL NEED TO CHECK WITH YOUR INSURANCE COMPANY FOR THE BENEFITS OF COVERAGE YOU HAVE FOR THIS PROCEDURE.  UNFORTUNATELY, NOT ALL INSURANCE COMPANIES HAVE BENEFITS TO COVER ALL OR PART OF THESE TYPES OF PROCEDURES.  IT IS YOUR RESPONSIBILITY TO CHECK YOUR BENEFITS, HOWEVER, WE WILL BE GLAD TO ASSIST YOU WITH ANY CODES YOUR INSURANCE COMPANY MAY NEED.    PLEASE NOTE THAT MOST INSURANCE COMPANIES WILL NOT COVER A SCREENING COLONOSCOPY FOR PEOPLE UNDER THE AGE OF 50  IF YOU HAVE BCBS INSURANCE, YOU MAY HAVE BENEFITS FOR A SCREENING COLONOSCOPY BUT IF POLYPS ARE FOUND THE DIAGNOSIS WILL CHANGE AND THEN YOU MAY HAVE A DEDUCTIBLE THAT WILL NEED TO BE MET. SO PLEASE MAKE SURE YOU CHECK YOUR BENEFITS FOR A SCREENING COLONOSCOPY AS WELL AS A DIAGNOSTIC COLONOSCOPY.

## 2019-03-21 NOTE — Progress Notes (Signed)
Gastroenterology Pre-Procedure Review  Request Date: 03/21/2019 Requesting Physician: Dr. Wende Neighbors, no previous TCS  PATIENT REVIEW QUESTIONS: The patient responded to the following health history questions as indicated:    1. Diabetes Melitis: No 2. Joint replacements in the past 12 months: No 3. Major health problems in the past 3 months: No 4. Has an artificial valve or MVP: No 5. Has a defibrillator: No 6. Has been advised in past to take antibiotics in advance of a procedure like teeth cleaning: No 7. Family history of colon cancer: No  8. Alcohol Use: Yes, a beer occasionally 9. History of sleep apnea: Yes  10. History of coronary artery or other vascular stents placed within the last 12 months: No 11. History of any prior anesthesia complications: No    MEDICATIONS & ALLERGIES:    Patient reports the following regarding taking any blood thinners:   Plavix? No Aspirin? No Coumadin? No Brilinta? No Xarelto? No Eliquis? No Pradaxa? No Savaysa? No Effient? No  Patient confirms/reports the following medications:  Current Outpatient Medications  Medication Sig Dispense Refill  . Cyanocobalamin (VITAMIN B-12 PO) Take 1 tablet by mouth as needed.     . enalapril (VASOTEC) 5 MG tablet Take 5 mg by mouth daily.    . ferrous sulfate 325 (65 FE) MG tablet Take 325 mg by mouth as needed.     . hydrochlorothiazide (MICROZIDE) 12.5 MG capsule Take 12.5 mg by mouth daily.    Marland Kitchen ibuprofen (ADVIL,MOTRIN) 200 MG tablet Take 400-600 mg by mouth as needed for headache or moderate pain.     Marland Kitchen levothyroxine (SYNTHROID, LEVOTHROID) 75 MCG tablet Take 75 mcg by mouth daily before breakfast.    . Multiple Minerals (CALCIUM/MAGNESIUM/ZINC PO) Take 1 tablet by mouth as needed.     . Potassium 99 MG TABS Take 99 mg by mouth as needed.      No current facility-administered medications for this visit.     Patient confirms/reports the following allergies:  No Known Allergies  No orders of the  defined types were placed in this encounter.   AUTHORIZATION INFORMATION Primary Insurance:[employer] [place] ,  ID #: [id number],  Group #: [id number] Pre-Cert / Auth required: No, not required  SCHEDULE INFORMATION: Procedure has been scheduled as follows:  Date: 03/29/2019, Time: 8:30 Location: [facility] with Dr. Oneida Alar  This Gastroenterology Pre-Precedure Review Form is being routed to the following provider(s): Walden Field, NP

## 2019-03-21 NOTE — Progress Notes (Signed)
Ok to schedule.  Please have the patient write down his CPAP settings and bring those to endoscopy with him in case they need to apply CPAP in recovery.

## 2019-03-21 NOTE — Progress Notes (Signed)
Emailed all information (prep instructions and etc) to pt per his request.  Pt aware to call me back if he doesn't receive them today.  Pt voiced understanding.

## 2019-03-21 NOTE — Progress Notes (Signed)
Pt made aware to bring CPAP settings to Endo on procedure day in case they need them (per EG's recommendations).  Pt voiced understanding.

## 2019-03-21 NOTE — Addendum Note (Signed)
Addended by: Metro Kung on: 03/21/2019 12:03 PM   Modules accepted: Orders, SmartSet

## 2019-03-27 ENCOUNTER — Other Ambulatory Visit: Payer: Self-pay

## 2019-03-27 ENCOUNTER — Other Ambulatory Visit (HOSPITAL_COMMUNITY)
Admission: RE | Admit: 2019-03-27 | Discharge: 2019-03-27 | Disposition: A | Discharge status: Home / Self Care | Payer: BC Managed Care – PPO | Source: Ambulatory Visit | Attending: Gastroenterology | Admitting: Gastroenterology

## 2019-03-27 DIAGNOSIS — Z01812 Encounter for preprocedural laboratory examination: Secondary | ICD-10-CM | POA: Insufficient documentation

## 2019-03-27 DIAGNOSIS — Z20828 Contact with and (suspected) exposure to other viral communicable diseases: Secondary | ICD-10-CM | POA: Insufficient documentation

## 2019-03-27 DIAGNOSIS — Z20822 Contact with and (suspected) exposure to covid-19: Secondary | ICD-10-CM

## 2019-03-27 LAB — SARS CORONAVIRUS 2 (TAT 6-24 HRS): SARS Coronavirus 2: NEGATIVE

## 2019-03-27 NOTE — Addendum Note (Signed)
Addended by: Howie Ill on: 03/27/2019 08:38 AM   Modules accepted: Orders

## 2019-03-29 ENCOUNTER — Ambulatory Visit (HOSPITAL_COMMUNITY)
Admission: RE | Admit: 2019-03-29 | Discharge: 2019-03-29 | Disposition: A | Discharge status: Home / Self Care | Payer: BC Managed Care – PPO | Attending: Gastroenterology | Admitting: Gastroenterology

## 2019-03-29 ENCOUNTER — Telehealth: Payer: Self-pay | Admitting: Gastroenterology

## 2019-03-29 ENCOUNTER — Other Ambulatory Visit: Payer: Self-pay

## 2019-03-29 ENCOUNTER — Encounter (HOSPITAL_COMMUNITY)
Admission: RE | Disposition: A | Discharge status: Home / Self Care | Payer: Self-pay | Source: Home / Self Care | Attending: Gastroenterology

## 2019-03-29 ENCOUNTER — Encounter (HOSPITAL_COMMUNITY): Payer: Self-pay | Admitting: *Deleted

## 2019-03-29 DIAGNOSIS — K5730 Diverticulosis of large intestine without perforation or abscess without bleeding: Secondary | ICD-10-CM | POA: Insufficient documentation

## 2019-03-29 DIAGNOSIS — Z1211 Encounter for screening for malignant neoplasm of colon: Secondary | ICD-10-CM

## 2019-03-29 DIAGNOSIS — I10 Essential (primary) hypertension: Secondary | ICD-10-CM | POA: Insufficient documentation

## 2019-03-29 DIAGNOSIS — Z791 Long term (current) use of non-steroidal anti-inflammatories (NSAID): Secondary | ICD-10-CM | POA: Diagnosis not present

## 2019-03-29 DIAGNOSIS — Z79899 Other long term (current) drug therapy: Secondary | ICD-10-CM | POA: Diagnosis not present

## 2019-03-29 DIAGNOSIS — E039 Hypothyroidism, unspecified: Secondary | ICD-10-CM | POA: Insufficient documentation

## 2019-03-29 DIAGNOSIS — D649 Anemia, unspecified: Secondary | ICD-10-CM | POA: Diagnosis not present

## 2019-03-29 DIAGNOSIS — G4730 Sleep apnea, unspecified: Secondary | ICD-10-CM | POA: Diagnosis not present

## 2019-03-29 DIAGNOSIS — Z825 Family history of asthma and other chronic lower respiratory diseases: Secondary | ICD-10-CM | POA: Diagnosis not present

## 2019-03-29 DIAGNOSIS — Q438 Other specified congenital malformations of intestine: Secondary | ICD-10-CM | POA: Diagnosis not present

## 2019-03-29 DIAGNOSIS — K648 Other hemorrhoids: Secondary | ICD-10-CM | POA: Diagnosis not present

## 2019-03-29 DIAGNOSIS — K644 Residual hemorrhoidal skin tags: Secondary | ICD-10-CM | POA: Diagnosis not present

## 2019-03-29 HISTORY — PX: COLONOSCOPY: SHX5424

## 2019-03-29 SURGERY — COLONOSCOPY
Anesthesia: Moderate Sedation

## 2019-03-29 MED ORDER — SODIUM CHLORIDE 0.9 % IV SOLN
INTRAVENOUS | Status: DC
  Administered 2019-03-29: 08:00:00 via INTRAVENOUS

## 2019-03-29 MED ORDER — MIDAZOLAM HCL 5 MG/5ML IJ SOLN
INTRAMUSCULAR | Status: DC | PRN
  Administered 2019-03-29 (×2): 2 mg via INTRAVENOUS

## 2019-03-29 MED ORDER — MIDAZOLAM HCL 5 MG/5ML IJ SOLN
INTRAMUSCULAR | Status: AC
  Filled 2019-03-29: qty 10

## 2019-03-29 MED ORDER — MEPERIDINE HCL 100 MG/ML IJ SOLN
INTRAMUSCULAR | Status: AC
  Filled 2019-03-29: qty 2

## 2019-03-29 MED ORDER — MEPERIDINE HCL 100 MG/ML IJ SOLN
INTRAMUSCULAR | Status: DC | PRN
  Administered 2019-03-29: 25 mg via INTRAVENOUS
  Administered 2019-03-29: 50 mg via INTRAVENOUS

## 2019-03-29 NOTE — Op Note (Signed)
[facility] Patient Name: Jesse MochaRalph Roberts Procedure Date: 03/29/2019 7:41 AM MRN: [id number] Date of Birth: 1961-06-19 Attending MD: Jonette EvaSandi Fields MD, MD CSN: [id number] Age: [age] Admit Type: Outpatient Procedure:                Colonoscopy, SCREENING Indications:              Screening for colorectal malignant neoplasm Providers:                Jonette EvaSandi Fields MD, MD, Jannett CelestineAnitra Bell, RN, Edythe ClarityKelly Cox,                            Technician Referring MD:             Kathleene HazelJohn Z. Margo AyeHall MD Medicines:                Meperidine 75 mg IV, Midazolam 4 mg IV Complications:            No immediate complications. Estimated Blood Loss:     Estimated blood loss was minimal. Procedure:                Pre-Anesthesia Assessment:                           - Prior to the procedure, a History and Physical                            was performed, and patient medications and                            allergies were reviewed. The patient's tolerance of                            previous anesthesia was also reviewed. The risks                            and benefits of the procedure and the sedation                            options and risks were discussed with the patient.                            All questions were answered, and informed consent                            was obtained. Prior Anticoagulants: The patient has                            taken no previous anticoagulant or antiplatelet                            agents except for NSAID medication. ASA Grade                            Assessment: II - A patient with mild systemic  disease. After reviewing the risks and benefits,                            the patient was deemed in satisfactory condition to                            undergo the procedure. After obtaining informed                            consent, the colonoscope was passed under direct                            vision. Throughout the procedure, the patient's                             blood pressure, pulse, and oxygen saturations were                            monitored continuously. The CF-[id number] ([id number])                            scope was introduced through the anus and advanced                            to the the cecum, identified by appendiceal orifice                            and ileocecal valve. The colonoscopy was somewhat                            difficult due to a tortuous colon. Successful                            completion of the procedure was aided by                            straightening and shortening the scope to obtain                            bowel loop reduction and COLOWRAP. The patient                            tolerated the procedure well. The quality of the                            bowel preparation was excellent. The ileocecal                            valve, appendiceal orifice, and rectum were                            photographed. Scope In: 8:53:46 AM Scope Out: 9:05:31 AM Scope Withdrawal Time: 0 hours 9 minutes 32 seconds  Total Procedure Duration: 0  hours 11 minutes 45 seconds  Findings:      Multiple small and large-mouthed diverticula were found in the       recto-sigmoid colon, sigmoid colon and descending colon.      External and internal hemorrhoids were found. The hemorrhoids were       moderate.      The recto-sigmoid colon and sigmoid colon were mildly tortuous. Impression:               - MODERATE Diverticulosis in the recto-sigmoid                            colon, in the sigmoid colon and in the descending                            colon.                           - External and internal hemorrhoids.                           - Tortuous colon. Moderate Sedation:      Moderate (conscious) sedation was administered by the endoscopy nurse       and supervised by the endoscopist. The following parameters were       monitored: oxygen saturation, heart rate, blood pressure, and  response       to care. Total physician intraservice time was 25 minutes. Recommendation:           - Patient has a contact number available for                            emergencies. The signs and symptoms of potential                            delayed complications were discussed with the                            patient. Return to normal activities tomorrow.                            Written discharge instructions were provided to the                            patient.                           - High fiber diet.                           - Continue present medications. STOP PO IRON.                            REPEAT CBC/FERRITIN IN 3 MOS. IF Hb DROPS, PT NEEDS                            AN EGD.                           -  Await pathology results.                           - Repeat colonoscopy in 10 years for surveillance. Procedure Code(s):        --- Professional ---                           [phone], Colonoscopy, flexible; diagnostic, including                            collection of specimen(s) by brushing or washing,                            when performed (separate procedure)                           99153, Moderate sedation; each additional 15                            minutes intraservice time                           G0500, Moderate sedation services provided by the                            same physician or other qualified health care                            professional performing a gastrointestinal                            endoscopic service that sedation supports,                            requiring the presence of an independent trained                            observer to assist in the monitoring of the                            patient's level of consciousness and physiological                            status; initial 15 minutes of intra-service time;                            patient age 73 years or older (additional time may                             be reported with 82641, as appropriate) Diagnosis Code(s):        --- Professional ---                           Z12.11, Encounter for screening for malignant  neoplasm of colon                           K64.8, Other hemorrhoids                           K57.30, Diverticulosis of large intestine without                            perforation or abscess without bleeding                           Q43.8, Other specified congenital malformations of                            intestine CPT copyright 2019 American Medical Association. All rights reserved. The codes documented in this report are preliminary and upon coder review may  be revised to meet current compliance requirements. Jonette EvaSandi Fields, MD Jonette EvaSandi Fields MD, MD 03/29/2019 10:15:30 AM This report has been signed electronically. Number of Addenda: 0

## 2019-03-29 NOTE — H&P (Signed)
Primary Care Physician:  Celene Squibb, MD Primary Gastroenterologist:  Dr. Oneida Alar  Pre-Procedure History & Physical: HPI:  Jesse Roberts is a 58 y.o. male here for [place].  Past Medical History:  Diagnosis Date  . Anemia   . Hypertension   . Hypothyroidism   . Sleep apnea   . Thyroid disease    hypothyroidism    Past Surgical History:  Procedure Laterality Date  . TRIGGER FINGER RELEASE Left 08/02/2018   Procedure: TRIGGER FINGER RELEASE LEFT LONG FINGER;  Surgeon: Carole Civil, MD;  Location: [facility];  Service: Orthopedics;  Laterality: Left;    Prior to Admission medications   Medication Sig Start Date End Date Taking? Authorizing Provider  Cyanocobalamin (VITAMIN B-12 PO) Take 1 tablet by mouth as needed.    Yes [provider]  enalapril (VASOTEC) 5 MG tablet Take 5 mg by mouth daily.   Yes [provider]  ferrous sulfate 325 (65 FE) MG tablet Take 325 mg by mouth daily.    Yes [provider]  hydrochlorothiazide (MICROZIDE) 12.5 MG capsule Take 12.5 mg by mouth daily.   Yes [provider]  ibuprofen (ADVIL,MOTRIN) 200 MG tablet Take 400-600 mg by mouth as needed for headache or moderate pain.    Yes [provider]  levothyroxine (SYNTHROID, LEVOTHROID) 75 MCG tablet Take 75 mcg by mouth daily before breakfast.   Yes [provider]  Potassium 99 MG TABS Take 99 mg by mouth as needed.    Yes [provider]  Multiple Minerals (CALCIUM/MAGNESIUM/ZINC PO) Take 1 tablet by mouth as needed.     [provider]    Allergies as of 03/21/2019  . (No Known Allergies)    Family History  Problem Relation Age of Onset  . Healthy Mother   . COPD Father   . Colon cancer Neg Hx     Social History   Socioeconomic History  . Marital status: Married    Spouse name: Not on file  . Number of children: Not on file  . Years of education: Not on file  . Highest education level: Not on  file  Occupational History  . Not on file  Social Needs  . Financial resource strain: Not on file  . Food insecurity    Worry: Not on file    Inability: Not on file  . Transportation needs    Medical: Not on file    Non-medical: Not on file  Tobacco Use  . Smoking status: Never Smoker  . Smokeless tobacco: Never Used  Substance and Sexual Activity  . Alcohol use: Yes    Comment: socially  . Drug use: No  . Sexual activity: Yes  Lifestyle  . Physical activity    Days per week: Not on file    Minutes per session: Not on file  . Stress: Not on file  Relationships  . Social Herbalist on phone: Not on file    Gets together: Not on file    Attends religious service: Not on file    Active member of club or organization: Not on file    Attends meetings of clubs or organizations: Not on file    Relationship status: Not on file  . Intimate partner violence    Fear of current or ex partner: Not on file    Emotionally abused: Not on file    Physically abused: Not on file    Forced sexual activity:  Not on file  Other Topics Concern  . Not on file  Social History Narrative  . Not on file    Review of Systems: See HPI, otherwise negative ROS   Physical Exam: BP 122/86   Pulse 83   Temp 98 F (36.7 C) (Oral)   Resp 13   Ht 5\' 7"  (1.702 m)   Wt 88 kg   SpO2 100%   BMI 30.38 kg/m  General:   Alert,  pleasant and cooperative in NAD Head:  Normocephalic and atraumatic. Neck:  Supple; Lungs:  Clear throughout to auscultation.    Heart:  Regular rate and rhythm. Abdomen:  Soft, nontender and nondistended. Normal bowel sounds, without guarding, and without rebound.   Neurologic:  Alert and  oriented x4;  grossly normal neurologically.  Impression/Plan:    SCREENING  Plan:  1. TCS TODAY DISCUSSED PROCEDURE, BENEFITS, & RISKS: < 1% chance of medication reaction, bleeding, perforation, ASPIRATION, or rupture of spleen/liver requiring surgery to fix it and  missed polyps < 1 cm 10-20% of the time.

## 2019-03-29 NOTE — Discharge Instructions (Signed)
You DID NOT HAVE ANY POLYPS. YOU HAVE DIVERTICULOSIS IN YOUR LEFT COLON. You have MODERATE internal AND EXTERNAL hemorrhoids.   STOP IRON. REPEAT CBC/FERRITIN IN 3 MOS. IF YOUR BLOOD COUNT DROPS, YOU NEED AN upper endoscopy.   DRINK WATER TO KEEP URINE LIGHT YELLOW.  FOLLOW A HIGH FIBER DIET. AVOID ITEMS THAT CAUSE BLOATING & GAS. SEE INFO BELOW.   USE PREPARATION H FOUR TIMES  A DAY IF NEEDED TO RELIEVE RECTAL PAIN/PRESSURE/BLEEDING.   Next colonoscopy in 10 years.   Colonoscopy Care After Read the instructions outlined below and refer to this sheet in the next week. These discharge instructions provide you with general information on caring for yourself after you leave the hospital. While your treatment has been planned according to the most current medical practices available, unavoidable complications occasionally occur. If you have any problems or questions after discharge, call DR. Errick Salts, [phone].  ACTIVITY  You may resume your regular activity, but move at a slower pace for the next 24 hours.   Take frequent rest periods for the next 24 hours.   Walking will help get rid of the air and reduce the bloated feeling in your belly (abdomen).   No driving for 24 hours (because of the medicine (anesthesia) used during the test).   You may shower.   Do not sign any important legal documents or operate any machinery for 24 hours (because of the anesthesia used during the test).    NUTRITION  Drink plenty of fluids.   You may resume your normal diet as instructed by your doctor.   Begin with a light meal and progress to your normal diet. Heavy or fried foods are harder to digest and may make you feel sick to your stomach (nauseated).   Avoid alcoholic beverages for 24 hours or as instructed.    MEDICATIONS  You may resume your normal medications.   WHAT YOU CAN EXPECT TODAY  Some feelings of bloating in the abdomen.   Passage of more gas than usual.    Spotting of blood in your stool or on the toilet paper  .  IF YOU HAD POLYPS REMOVED DURING THE COLONOSCOPY:  Eat a soft diet IF YOU HAVE NAUSEA, BLOATING, ABDOMINAL PAIN, OR VOMITING.    FINDING OUT THE RESULTS OF YOUR TEST Not all test results are available during your visit. DR. Darrick PennaFIELDS WILL CALL YOU WITHIN 7 DAYS OF YOUR PROCEDUE WITH YOUR RESULTS. Do not assume everything is normal if you have not heard from DR. Mitzy Naron IN ONE WEEK, CALL HER OFFICE AT [phone].  SEEK IMMEDIATE MEDICAL ATTENTION AND CALL THE OFFICE: [phone] IF:  You have more than a spotting of blood in your stool.   Your belly is swollen (abdominal distention).   You are nauseated or vomiting.   You have a temperature over 101F.   You have abdominal pain or discomfort that is severe or gets worse throughout the day.    High-Fiber Diet A high-fiber diet changes your normal diet to include more whole grains, legumes, fruits, and vegetables. Changes in the diet involve replacing refined carbohydrates with unrefined foods. The calorie level of the diet is essentially unchanged. The Dietary Reference Intake (recommended amount) for adult males is 38 grams per day. For adult females, it is 25 grams per day. Pregnant and lactating women should consume 28 grams of fiber per day. Fiber is the intact part of a plant that is not broken down during digestion. Functional fiber is fiber that  has been isolated from the plant to provide a beneficial effect in the body.  PURPOSE  Increase stool bulk.   Ease and regulate bowel movements.   Lower cholesterol.   REDUCE RISK OF COLON CANCER  INDICATIONS THAT YOU NEED MORE FIBER  Constipation and hemorrhoids.   Uncomplicated diverticulosis (intestine condition) and irritable bowel syndrome.   Weight management.   As a protective measure against hardening of the arteries (atherosclerosis), diabetes, and cancer.   GUIDELINES FOR INCREASING FIBER IN THE  DIET  Start adding fiber to the diet slowly. A gradual increase of about 5 more grams (2 servings of most fruits or vegetables) per day is best. Too rapid an increase in fiber may result in constipation, flatulence, and bloating.   Drink enough water and fluids to keep your urine clear or pale yellow. Water, juice, or caffeine-free drinks are recommended. Not drinking enough fluid may cause constipation.   Eat a variety of high-fiber foods rather than one type of fiber.   Try to increase your intake of fiber through using high-fiber foods rather than fiber pills or supplements that contain small amounts of fiber.   The goal is to change the types of food eaten. Do not supplement your present diet with high-fiber foods, but replace foods in your present diet.    Diverticulosis Diverticulosis is a common condition that develops when small pouches (diverticula) form in the wall of the colon. The risk of diverticulosis increases with age. It happens more often in people who eat a low-fiber diet. Most individuals with diverticulosis have no symptoms. Those individuals with symptoms usually experience belly (abdominal) pain, constipation, or loose stools (diarrhea).  HOME CARE INSTRUCTIONS  Increase the amount of fiber in your diet as directed by your caregiver or dietician. This may reduce symptoms of diverticulosis.   Drink at least 6 to 8 glasses of water each day to prevent constipation.   Try not to strain when you have a bowel movement.   THERE IS NO NEED TO Avoid nuts and seeds to prevent complications.   FOODS HAVING HIGH FIBER CONTENT INCLUDE:  Fruits. Apple, peach, pear, tangerine, raisins, prunes.   Vegetables. Brussels sprouts, asparagus, broccoli, cabbage, carrot, cauliflower, romaine lettuce, spinach, summer squash, tomato, winter squash, zucchini.   Starchy Vegetables. Baked beans, kidney beans, lima beans, split peas, lentils, potatoes (with skin).      Hemorrhoids Hemorrhoids are dilated (enlarged) veins around the rectum. Sometimes clots will form in the veins. This makes them swollen and painful. These are called thrombosed hemorrhoids. Causes of hemorrhoids include:  Constipation.   Straining to have a bowel movement.   HEAVY LIFTING  HOME CARE INSTRUCTIONS  Eat a well balanced diet and drink 6 to 8 glasses of water every day to avoid constipation. You may also use a bulk laxative.   Avoid straining to have bowel movements.   Keep anal area dry and clean.   Do not use a donut shaped pillow or sit on the toilet for long periods. This increases blood pooling and pain.   Move your bowels when your body has the urge; this will require less straining and will decrease pain and pressure.

## 2019-03-29 NOTE — Telephone Encounter (Signed)
CBC/FERRITIN IN DEC 2020.

## 2019-04-01 NOTE — Telephone Encounter (Signed)
What diagnosis?

## 2019-04-02 ENCOUNTER — Encounter (HOSPITAL_COMMUNITY): Payer: Self-pay | Admitting: Gastroenterology

## 2019-04-03 NOTE — Telephone Encounter (Signed)
IRON DEFICIENCY ANEMIA.

## 2019-04-04 ENCOUNTER — Other Ambulatory Visit: Payer: Self-pay

## 2019-04-04 DIAGNOSIS — D509 Iron deficiency anemia, unspecified: Secondary | ICD-10-CM

## 2019-04-04 NOTE — Telephone Encounter (Signed)
Lab orders on file for 07/04/2019.

## 2019-04-09 ENCOUNTER — Ambulatory Visit: Payer: 59

## 2019-06-12 ENCOUNTER — Other Ambulatory Visit: Payer: Self-pay

## 2019-06-12 DIAGNOSIS — D509 Iron deficiency anemia, unspecified: Secondary | ICD-10-CM

## 2019-07-03 ENCOUNTER — Telehealth: Payer: Self-pay | Admitting: Internal Medicine

## 2019-07-03 NOTE — Telephone Encounter (Signed)
Pt received letter that he needed blood work done on 07/04/2019. He has questions about that. Please call him at [phone]

## 2019-07-03 NOTE — Telephone Encounter (Signed)
Spoke with pt. Pt is aware that orders were placed in September 2020 per Dr. Oneida Alar to recheck his iron def anemia in December. Pt will have his labs done next week.

## 2019-09-07 IMAGING — CR DG HAND COMPLETE 3+V*L*
1 series · 3 of 3 positions shown · non-contrast
Comparison: None.

CLINICAL DATA: Puncture wound over the second metacarpal.

EXAM:
LEFT HAND - COMPLETE 3+ VIEW

[Series 1: pa · 0.17mm/px · 3 of 3 slices shown]
[im 1/3]
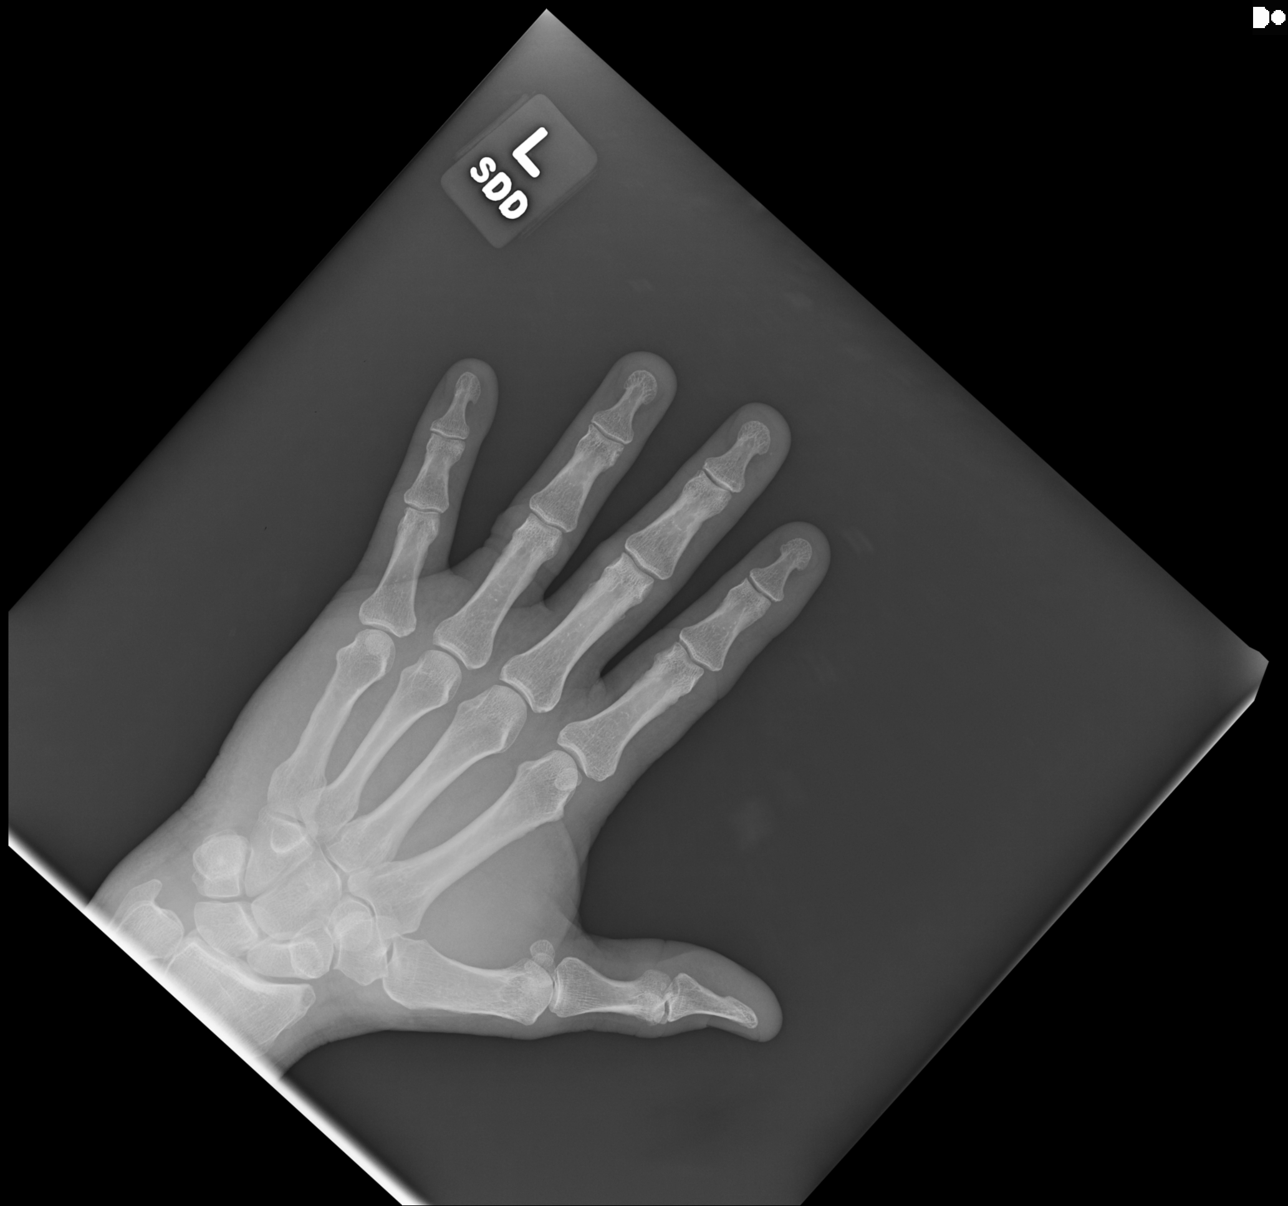
[im 2/3]
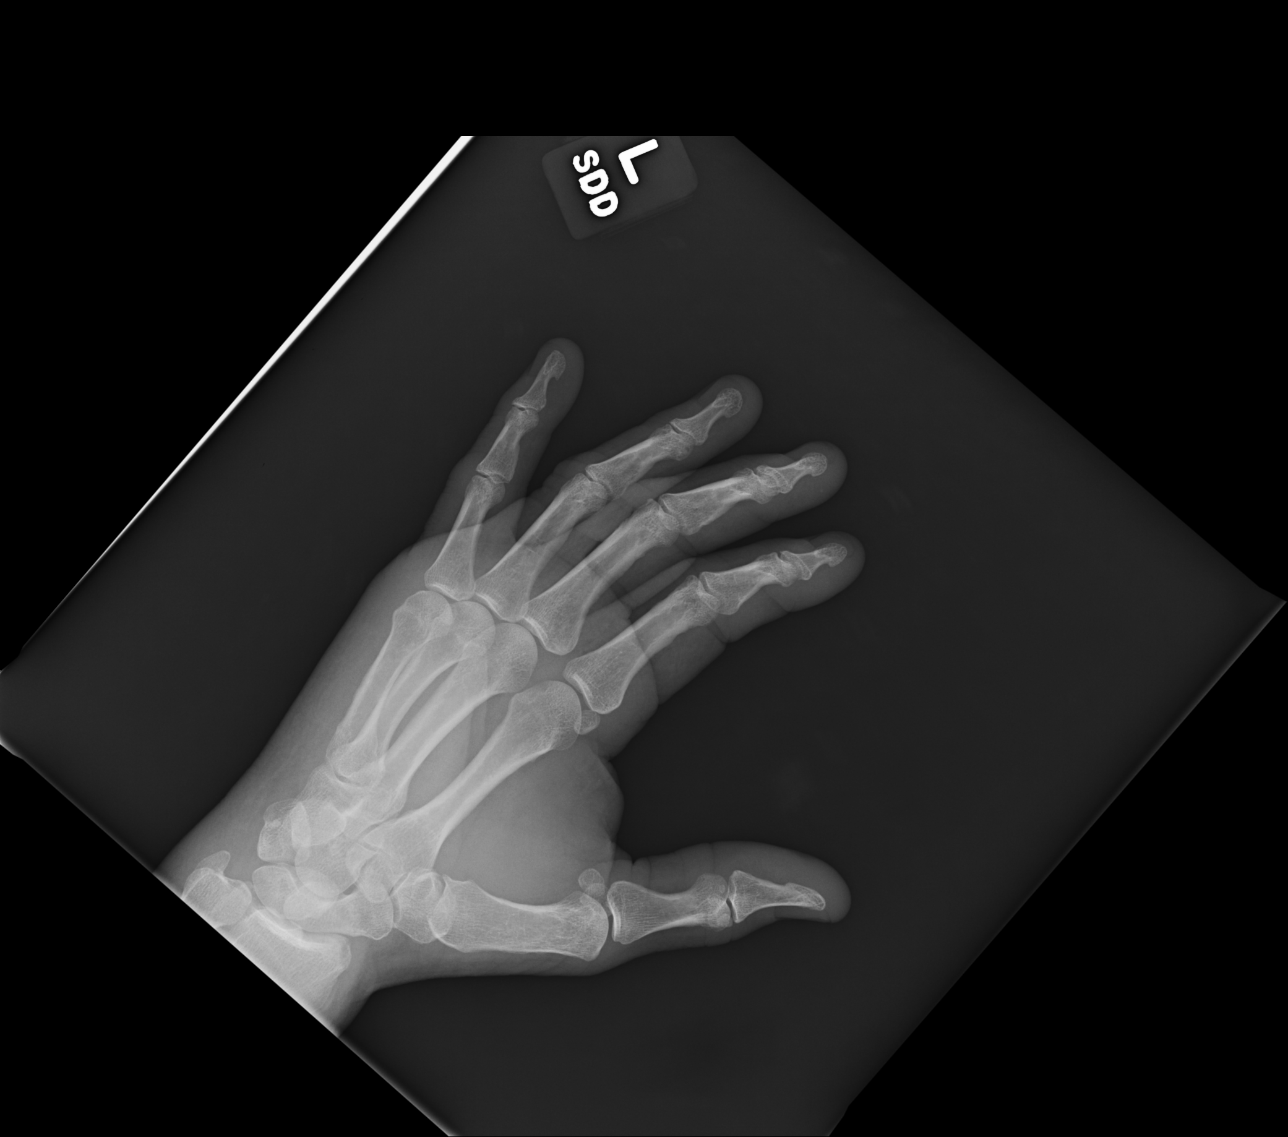
[im 3/3]
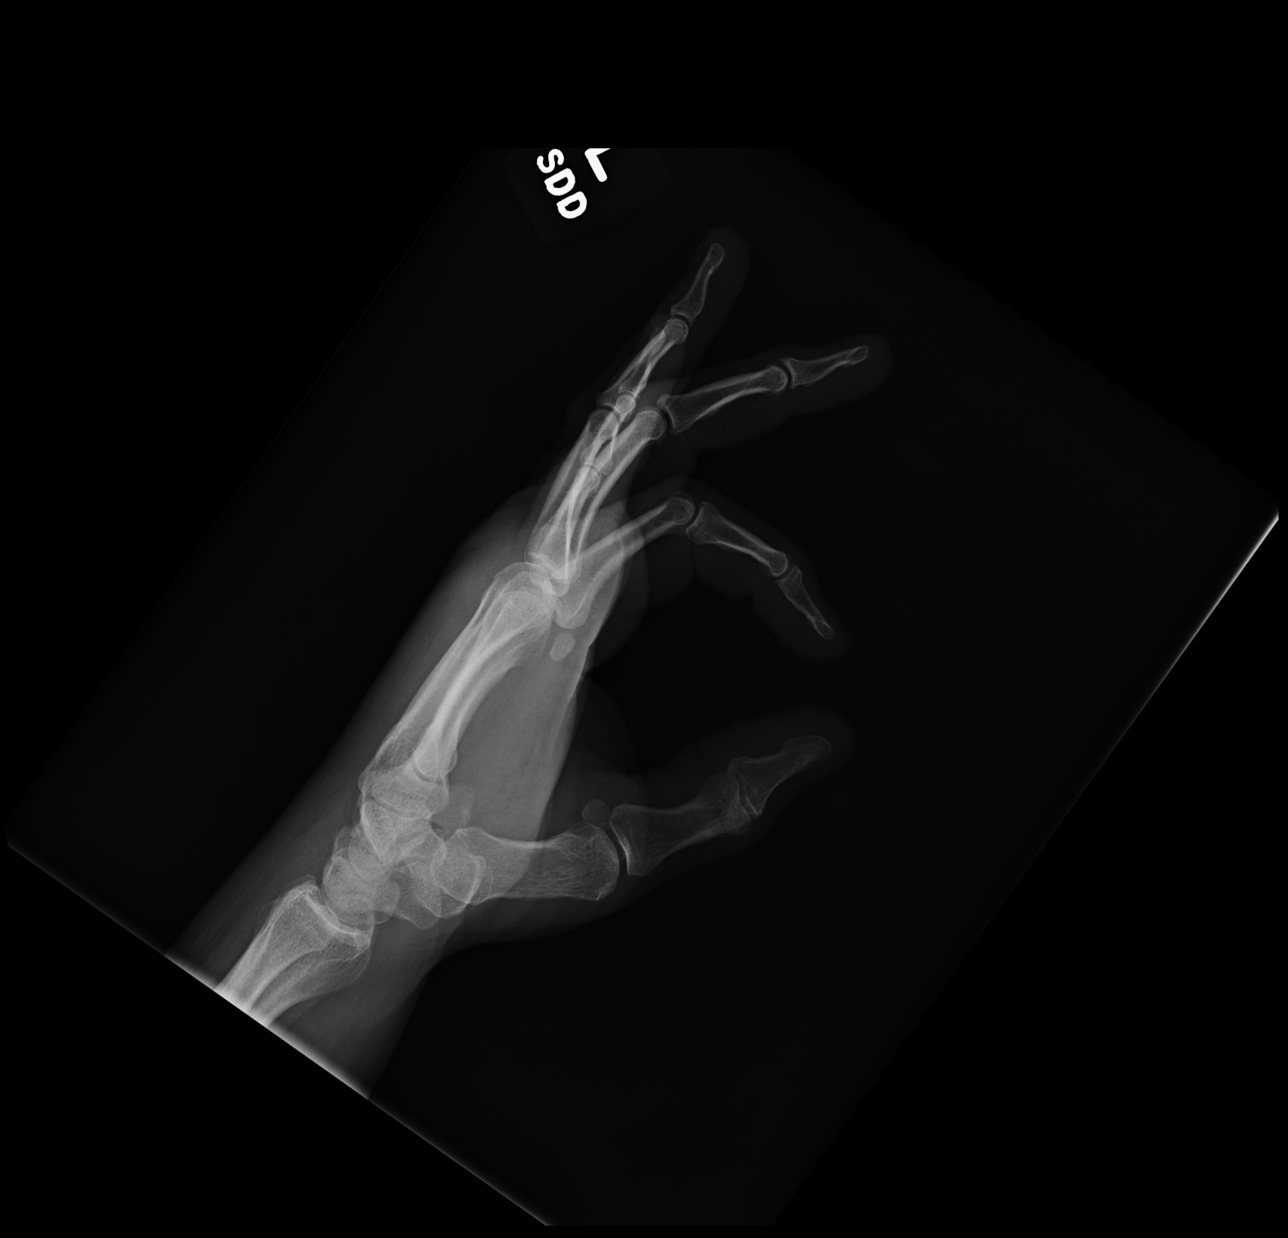

[3 of 3 positions shown; findings below may reference images not displayed]

FINDINGS: Dorsal soft tissue swelling of the left hand. No radiopaque foreign
body nor osseous involvement. Reported soft tissue puncture wound is
radiographically occult. Joint spaces are intact.
IMPRESSION: No osseous involvement status post puncture wound over the second
metacarpal. No radiopaque foreign body. Dorsal soft tissue swelling
of the hand.

## 2019-09-19 DIAGNOSIS — B001 Herpesviral vesicular dermatitis: Secondary | ICD-10-CM | POA: Diagnosis not present

## 2019-09-19 DIAGNOSIS — L570 Actinic keratosis: Secondary | ICD-10-CM | POA: Diagnosis not present

## 2019-10-07 DIAGNOSIS — R946 Abnormal results of thyroid function studies: Secondary | ICD-10-CM | POA: Diagnosis not present

## 2019-10-07 DIAGNOSIS — M25562 Pain in left knee: Secondary | ICD-10-CM | POA: Diagnosis not present

## 2019-10-07 DIAGNOSIS — R2232 Localized swelling, mass and lump, left upper limb: Secondary | ICD-10-CM | POA: Diagnosis not present

## 2019-10-07 DIAGNOSIS — E039 Hypothyroidism, unspecified: Secondary | ICD-10-CM | POA: Diagnosis not present

## 2019-10-07 DIAGNOSIS — I10 Essential (primary) hypertension: Secondary | ICD-10-CM | POA: Diagnosis not present

## 2019-10-09 DIAGNOSIS — Z0001 Encounter for general adult medical examination with abnormal findings: Secondary | ICD-10-CM | POA: Diagnosis not present

## 2019-10-24 DIAGNOSIS — F331 Major depressive disorder, recurrent, moderate: Secondary | ICD-10-CM | POA: Diagnosis not present

## 2019-11-01 DIAGNOSIS — H9313 Tinnitus, bilateral: Secondary | ICD-10-CM | POA: Diagnosis not present

## 2019-11-01 DIAGNOSIS — H90A22 Sensorineural hearing loss, unilateral, left ear, with restricted hearing on the contralateral side: Secondary | ICD-10-CM | POA: Diagnosis not present

## 2019-11-01 DIAGNOSIS — H90A31 Mixed conductive and sensorineural hearing loss, unilateral, right ear with restricted hearing on the contralateral side: Secondary | ICD-10-CM | POA: Diagnosis not present

## 2019-11-29 DIAGNOSIS — F331 Major depressive disorder, recurrent, moderate: Secondary | ICD-10-CM | POA: Diagnosis not present

## 2019-12-06 DIAGNOSIS — H90A31 Mixed conductive and sensorineural hearing loss, unilateral, right ear with restricted hearing on the contralateral side: Secondary | ICD-10-CM | POA: Diagnosis not present

## 2019-12-06 DIAGNOSIS — H9313 Tinnitus, bilateral: Secondary | ICD-10-CM | POA: Diagnosis not present

## 2019-12-06 DIAGNOSIS — H90A22 Sensorineural hearing loss, unilateral, left ear, with restricted hearing on the contralateral side: Secondary | ICD-10-CM | POA: Diagnosis not present

## 2020-07-27 ENCOUNTER — Other Ambulatory Visit: Payer: Self-pay

## 2020-07-27 ENCOUNTER — Ambulatory Visit: Payer: BC Managed Care – PPO

## 2020-07-27 ENCOUNTER — Ambulatory Visit: Payer: BC Managed Care – PPO | Admitting: Orthopedic Surgery

## 2020-07-27 ENCOUNTER — Encounter: Payer: Self-pay | Admitting: Orthopedic Surgery

## 2020-07-27 VITALS — BP 133/95 | HR 76 | Ht 67.0 in | Wt 205.0 lb

## 2020-07-27 DIAGNOSIS — M65342 Trigger finger, left ring finger: Secondary | ICD-10-CM | POA: Diagnosis not present

## 2020-07-27 NOTE — Progress Notes (Signed)
Chief Complaint  Patient presents with  . Hand Pain    Left hand ring finger, pt reports that he will grab something and his hand locks up,    60 year old male had a left long finger trigger release last year did well presents now with a left ring finger catching and locking.  Past Medical History:  Diagnosis Date  . Anemia   . Hypertension   . Hypothyroidism   . Sleep apnea   . Thyroid disease    hypothyroidism    BP (!) 133/95   Pulse 76   Ht 5\' 7"  (1.702 m)   Wt 205 lb (93 kg)   BMI 32.11 kg/m  Left ring finger is tender over the A1 pulley we could not reproduce the triggering full range of motion ligaments intact tendons intact neurovascular exam intact  Encounter Diagnosis  Name Primary?  . Trigger finger, left ring finger Yes   Surgical versus nonoperative treatment with injection were offered and patient prefers injection if that does not work he says he will proceed with surgery  Trigger finger injection  Diagnosis  LEFT RING FINGER  Procedure injection A1 pulley Medications lidocaine 1% 1 mL and Depo-Medrol 40 mg 1 mL Skin prep alcohol and ethyl chloride Verbal consent was obtained Timeout confirmed the injection site  After cleaning the skin with alcohol and anesthetizing the skin with ethyl chloride the A1 pulley was palpated and the injection was performed without complication   XRAYS: NORMAL   F/U PRN

## 2020-07-27 NOTE — Patient Instructions (Addendum)
You have received an injection of steroids into the joint. 15% of patients will have increased pain within the 24 hours postinjection.   This is transient and will go away.   We recommend that you use ice packs on the injection site for 20 minutes every 2 hours and extra strength Tylenol 2 tablets every 8 as needed until the pain resolves.  If you continue to have pain after taking the Tylenol and using the ice please call the office for further instructions.    Trigger Finger  Trigger finger, also called stenosing tenosynovitis,  is a condition that causes a finger to get stuck in a bent position. Each finger has a tendon, which is a tough, cord-like tissue that connects muscle to bone, and each tendon passes through a tunnel of tissue called a tendon sheath. To move your finger, your tendon needs to glide freely through the sheath. Trigger finger happens when the tendon or the sheath thickens, making it difficult to move your finger. Trigger finger can affect any finger or a thumb. It may affect more than one finger. Mild cases may clear up with rest and medicine. Severe cases require more treatment. What are the causes? Trigger finger is caused by a thickened finger tendon or tendon sheath. The cause of this thickening is not known. What increases the risk? The following factors may make you more likely to develop this condition:  Doing activities that require a strong grip.  Having rheumatoid arthritis, gout, or diabetes.  Being 85-47 years old.  Being male. What are the signs or symptoms? Symptoms of this condition include:  Pain when bending or straightening your finger.  Tenderness or swelling where your finger attaches to the palm of your hand.  A lump in the palm of your hand or on the inside of your finger.  Hearing a noise like a pop or a snap when you try to straighten your finger.  Feeling a catching or locking sensation when you try to straighten your  finger.  Being unable to straighten your finger. How is this diagnosed? This condition is diagnosed based on your symptoms and a physical exam. How is this treated? This condition may be treated by:  Resting your finger and avoiding activities that make symptoms worse.  Wearing a finger splint to keep your finger extended.  Taking NSAIDs, such as ibuprofen, to relieve pain and swelling.  Doing gentle exercises to stretch the finger as told by your health care provider.  Having medicine that reduces swelling and inflammation (steroids) injected into the tendon sheath. Injections may need to be repeated.  Having surgery to open the tendon sheath. This may be done if other treatments do not work and you cannot straighten your finger. You may need physical therapy after surgery. Follow these instructions at home: If you have a splint:  Wear the splint as told by your health care provider. Remove it only as told by your health care provider.  Loosen it if your fingers tingle, become numb, or turn cold and blue.  Keep it clean.  If the splint is not waterproof: ? Do not let it get wet. ? Cover it with a watertight covering when you take a bath or shower. Managing pain, stiffness, and swelling If directed, apply heat to the affected area as often as told by your health care provider. Use the heat source that your health care provider recommends, such as a moist heat pack or a heating pad.  Place a towel  between your skin and the heat source.  Leave the heat on for 20-30 minutes.  Remove the heat if your skin turns bright red. This is especially important if you are unable to feel pain, heat, or cold. You may have a greater risk of getting burned. If directed, put ice on the painful area. To do this:  If you have a removable splint, remove it as told by your health care provider.  Put ice in a plastic bag.  Place a towel between your skin and the bag or between your splint and  the bag.  Leave the ice on for 20 minutes, 2-3 times a day.      Activity  Rest your finger as told by your health care provider. Avoid activities that make the pain worse.  Return to your normal activities as told by your health care provider. Ask your health care provider what activities are safe for you.  Do exercises as told by your health care provider.  Ask your health care provider when it is safe to drive if you have a splint on your hand. General instructions  Take over-the-counter and prescription medicines only as told by your health care provider.  Keep all follow-up visits as told by your health care provider. This is important. Contact a health care provider if:  Your symptoms are not improving with home care. Summary  Trigger finger, also called stenosing tenosynovitis, causes your finger to get stuck in a bent position. This can make it difficult and painful to straighten your finger.  This condition develops when a finger tendon or tendon sheath thickens.  Treatment may include resting your finger, wearing a splint, and taking medicines.  In severe cases, surgery to open the tendon sheath may be needed. This information is not intended to replace advice given to you by your health care provider. Make sure you discuss any questions you have with your health care provider. Document Revised: 11/19/2018 Document Reviewed: 11/19/2018 Elsevier Patient Education  2021 [employer].

## 2021-03-01 ENCOUNTER — Encounter: Payer: Self-pay | Admitting: Orthopedic Surgery

## 2021-03-01 ENCOUNTER — Other Ambulatory Visit: Payer: Self-pay

## 2021-03-01 ENCOUNTER — Ambulatory Visit (INDEPENDENT_AMBULATORY_CARE_PROVIDER_SITE_OTHER): Admitting: Orthopedic Surgery

## 2021-03-01 VITALS — BP 144/101 | HR 94 | Ht 67.0 in | Wt 203.0 lb

## 2021-03-01 DIAGNOSIS — F4310 Post-traumatic stress disorder, unspecified: Secondary | ICD-10-CM | POA: Insufficient documentation

## 2021-03-01 DIAGNOSIS — I10 Essential (primary) hypertension: Secondary | ICD-10-CM | POA: Insufficient documentation

## 2021-03-01 DIAGNOSIS — H908 Mixed conductive and sensorineural hearing loss, unspecified: Secondary | ICD-10-CM | POA: Insufficient documentation

## 2021-03-01 DIAGNOSIS — M65342 Trigger finger, left ring finger: Secondary | ICD-10-CM | POA: Diagnosis not present

## 2021-03-01 DIAGNOSIS — E039 Hypothyroidism, unspecified: Secondary | ICD-10-CM | POA: Insufficient documentation

## 2021-03-01 DIAGNOSIS — N529 Male erectile dysfunction, unspecified: Secondary | ICD-10-CM | POA: Insufficient documentation

## 2021-03-01 NOTE — Addendum Note (Signed)
Addended byCaffie Damme on: 03/01/2021 03:37 PM   Modules accepted: Orders, SmartSet

## 2021-03-01 NOTE — Progress Notes (Signed)
Chief Complaint  Patient presents with   Hand Problem    Left ring, wants to schedule trigger finger release     60 year old male has a triggering left ring finger and would like a release  He had a successful long finger release on the same hand  Review of systems negative   Past Medical History:  Diagnosis Date   Anemia    Hypertension    Hypothyroidism    Sleep apnea    Thyroid disease    hypothyroidism    Past Surgical History:  Procedure Laterality Date   COLONOSCOPY N/A 03/29/2019   Procedure: COLONOSCOPY;  Surgeon: West Bali, MD;  Location: [facility];  Service: Endoscopy;  Laterality: N/A;  8:30   TRIGGER FINGER RELEASE Left 08/02/2018   Procedure: TRIGGER FINGER RELEASE LEFT LONG FINGER;  Surgeon: Vickki Hearing, MD;  Location: [facility];  Service: Orthopedics;  Laterality: Left;    Social History   Tobacco Use   Smoking status: Never   Smokeless tobacco: Never  Vaping Use   Vaping Use: Never used  Substance Use Topics   Alcohol use: Yes    Comment: socially   Drug use: No    BP (!) 144/101   Pulse 94   Ht 5\' 7"  (1.702 m)   Wt 203 lb (92.1 kg)   BMI 31.79 kg/m   Appearance normal Oriented x3 Mood pleasant Gait normal  Tenderness over A1 pulley left ring finger Slight catching Normal strength Normal skin Normal color capillary refill No lymphadenopathy Normal sensation  Encounter Diagnosis  Name Primary?   Trigger finger, left ring finger Yes    Trigger finger release left ring finger

## 2021-03-01 NOTE — Patient Instructions (Signed)
Your surgery will be at [facility] by Dr Harrison  The hospital will contact you with a preoperative appointment to discuss Anesthesia. Please bring your medications with you for the appointment. They will tell you the arrival time and medication instructions when you have your preoperative evaluation. Do not wear nail polish the day of your surgery and if you take Phentermine you need to stop this medication ONE WEEK prior to your surgery.  

## 2021-03-09 NOTE — Patient Instructions (Signed)
Jesse Roberts  03/09/2021     @PREFPERIOPPHARMACY @   Your procedure is scheduled on 03/12/21.  Report to 03/14/21 at [phone] A.M.  Call this number if you have problems the morning of surgery:  [phone]   Remember:  Do not eat or drink after midnight.                      Take these medicines the morning of surgery with A SIP OF WATER levothyroxine.    Do not wear jewelry, make-up or nail polish.  Do not wear lotions, powders, or perfumes, or deodorant.  Do not shave 48 hours prior to surgery.  Men may shave face and neck.  Do not bring valuables to the hospital.  [facility] is not responsible for any belongings or valuables.  Contacts, dentures or bridgework may not be worn into surgery.  Leave your suitcase in the car.  After surgery it may be brought to your room.  For patients admitted to the hospital, discharge time will be determined by your treatment team.  Patients discharged the day of surgery will not be allowed to drive home.   Name and phone number of your driver:   family Special instructions:  no smoking or vaping 24 hours prior to procedure.  Please read over the following fact sheets that you were given. Surgical Site Infection Prevention, Anesthesia Post-op Instructions, and Care and Recovery After Surgery      PATIENT INSTRUCTIONS POST-ANESTHESIA  IMMEDIATELY FOLLOWING SURGERY:  Do not drive or operate machinery for the first twenty four hours after surgery.  Do not make any important decisions for twenty four hours after surgery or while taking narcotic pain medications or sedatives.  If you develop intractable nausea and vomiting or a severe headache please notify your doctor immediately.  FOLLOW-UP:  Please make an appointment with your surgeon as instructed. You do not need to follow up with anesthesia unless specifically instructed to do so.  WOUND CARE INSTRUCTIONS (if applicable):  Keep a dry clean dressing on the anesthesia/puncture wound  site if there is drainage.  Once the wound has quit draining you may leave it open to air.  Generally you should leave the bandage intact for twenty four hours unless there is drainage.  If the epidural site drains for more than 36-48 hours please call the anesthesia department.  QUESTIONS?:  Please feel free to call your physician or the hospital operator if you have any questions, and they will be happy to assist you.       Trigger Finger  Trigger finger, also called stenosing tenosynovitis,  is a condition that causes a finger to get stuck in a bent position. Each finger has a tendon, which is a tough, cord-like tissue that connects muscle tobone, and each tendon passes through a tunnel of tissue called a tendon sheath. To move your finger, your tendon needs to glide freely through the sheath. Trigger finger happens when the tendon or the sheath thickens, making it difficult to move your finger. Trigger finger can affect any finger or a thumb. It may affect more than one finger. Mild cases may clear up with rest andmedicine. Severe cases require more treatment. What are the causes? Trigger finger is caused by a thickened finger tendon or tendon sheath. Thecause of this thickening is not known. What increases the risk? The following factors may make you more likely to develop this condition: Doing activities that require a strong grip.  Having rheumatoid arthritis, gout, or diabetes. Being 54-33 years old. Being male. What are the signs or symptoms? Symptoms of this condition include: Pain when bending or straightening your finger. Tenderness or swelling where your finger attaches to the palm of your hand. A lump in the palm of your hand or on the inside of your finger. Hearing a noise like a pop or a snap when you try to straighten your finger. Feeling a catching or locking sensation when you try to straighten your finger. Being unable to straighten your finger. How is this  diagnosed? This condition is diagnosed based on your symptoms and a physical exam. How is this treated? This condition may be treated by: Resting your finger and avoiding activities that make symptoms worse. Wearing a finger splint to keep your finger extended. Taking NSAIDs, such as ibuprofen, to relieve pain and swelling. Doing gentle exercises to stretch the finger as told by your health care provider. Having medicine that reduces swelling and inflammation (steroids) injected into the tendon sheath. Injections may need to be repeated. Having surgery to open the tendon sheath. This may be done if other treatments do not work and you cannot straighten your finger. You may need physical therapy after surgery. Follow these instructions at home: If you have a splint: Wear the splint as told by your health care provider. Remove it only as told by your health care provider. Loosen it if your fingers tingle, become numb, or turn cold and blue. Keep it clean. If the splint is not waterproof: Do not let it get wet. Cover it with a watertight covering when you take a bath or shower. Managing pain, stiffness, and swelling     If directed, apply heat to the affected area as often as told by your health care provider. Use the heat source that your health care provider recommends, such as a moist heat pack or a heating pad. Place a towel between your skin and the heat source. Leave the heat on for 20-30 minutes. Remove the heat if your skin turns bright red. This is especially important if you are unable to feel pain, heat, or cold. You may have a greater risk of getting burned. If directed, put ice on the painful area. To do this: If you have a removable splint, remove it as told by your health care provider. Put ice in a plastic bag. Place a towel between your skin and the bag or between your splint and the bag. Leave the ice on for 20 minutes, 2-3 times a day.  Activity Rest your finger as  told by your health care provider. Avoid activities that make the pain worse. Return to your normal activities as told by your health care provider. Ask your health care provider what activities are safe for you. Do exercises as told by your health care provider. Ask your health care provider when it is safe to drive if you have a splint on your hand. General instructions Take over-the-counter and prescription medicines only as told by your health care provider. Keep all follow-up visits as told by your health care provider. This is important. Contact a health care provider if: Your symptoms are not improving with home care. Summary Trigger finger, also called stenosing tenosynovitis, causes your finger to get stuck in a bent position. This can make it difficult and painful to straighten your finger. This condition develops when a finger tendon or tendon sheath thickens. Treatment may include resting your finger, wearing a splint, and  taking medicines. In severe cases, surgery to open the tendon sheath may be needed. This information is not intended to replace advice given to you by your health care provider. Make sure you discuss any questions you have with your healthcare provider. Document Revised: 11/19/2018 Document Reviewed: 11/19/2018 Elsevier Patient Education  2022 [employer].

## 2021-03-10 ENCOUNTER — Other Ambulatory Visit: Payer: Self-pay

## 2021-03-10 ENCOUNTER — Encounter (HOSPITAL_COMMUNITY)
Admission: RE | Admit: 2021-03-10 | Discharge: 2021-03-10 | Disposition: A | Discharge status: Home / Self Care | Source: Ambulatory Visit | Attending: Orthopedic Surgery | Admitting: Orthopedic Surgery

## 2021-03-10 ENCOUNTER — Encounter (HOSPITAL_COMMUNITY): Payer: Self-pay

## 2021-03-10 DIAGNOSIS — Z01818 Encounter for other preprocedural examination: Secondary | ICD-10-CM | POA: Diagnosis not present

## 2021-03-10 LAB — BASIC METABOLIC PANEL
Anion gap: 8 (ref 5–15)
BUN: 15 mg/dL (ref 6–20)
CO2: 23 mmol/L (ref 22–32)
Calcium: 8.9 mg/dL (ref 8.9–10.3)
Chloride: 106 mmol/L (ref 98–111)
Creatinine, Ser: 0.87 mg/dL (ref 0.61–1.24)
GFR, Estimated: >60 mL/min (ref >60)
Glucose, Bld: 86 mg/dL (ref 70–99)
Potassium: 4 mmol/L (ref 3.5–5.1)
Sodium: 137 mmol/L (ref 135–145)

## 2021-03-10 LAB — CBC WITH DIFFERENTIAL/PLATELET
Abs Immature Granulocytes: 0.01 10*3/uL (ref 0.00–0.07)
Basophils Absolute: 0.1 10*3/uL (ref 0.0–0.1)
Basophils Relative: 1 %
Eosinophils Absolute: 0.3 10*3/uL (ref 0.0–0.5)
Eosinophils Relative: 5 %
HCT: 44.1 % (ref 39.0–52.0)
Hemoglobin: 15.4 g/dL (ref 13.0–17.0)
Immature Granulocytes: 0 %
Lymphocytes Relative: 25 %
Lymphs Abs: 1.7 10*3/uL (ref 0.7–4.0)
MCH: 32 pg (ref 26.0–34.0)
MCHC: 34.9 g/dL (ref 30.0–36.0)
MCV: 91.7 fL (ref 80.0–100.0)
Monocytes Absolute: 0.4 10*3/uL (ref 0.1–1.0)
Monocytes Relative: 7 %
Neutro Abs: 4.3 10*3/uL (ref 1.7–7.7)
Neutrophils Relative %: 62 %
Platelets: 369 10*3/uL (ref 150–400)
RBC: 4.81 MIL/uL (ref 4.22–5.81)
RDW: 12.3 % (ref 11.5–15.5)
WBC: 6.8 10*3/uL (ref 4.0–10.5)
nRBC: 0 % (ref 0.0–0.2)

## 2021-03-11 NOTE — H&P (Signed)
  Chief Complaint  Patient presents with   Hand Problem      Left ring, wants to schedule trigger finger release       60 year old male has a triggering left ring finger and would like a release  He had a successful long finger release on the same hand  Review of systems negative        Past Medical History:  Diagnosis Date   Anemia     Hypertension     Hypothyroidism     Sleep apnea     Thyroid disease      hypothyroidism           Past Surgical History:  Procedure Laterality Date   COLONOSCOPY N/A 03/29/2019    Procedure: COLONOSCOPY;  Surgeon: West Bali, MD;  Location: [facility];  Service: Endoscopy;  Laterality: N/A;  8:30   TRIGGER FINGER RELEASE Left 08/02/2018    Procedure: TRIGGER FINGER RELEASE LEFT LONG FINGER;  Surgeon: Vickki Hearing, MD;  Location: [facility];  Service: Orthopedics;  Laterality: Left;      Social History         Tobacco Use   Smoking status: Never   Smokeless tobacco: Never  Vaping Use   Vaping Use: Never used  Substance Use Topics   Alcohol use: Yes      Comment: socially   Drug use: No      BP (!) 144/101   Pulse 94   Ht 5\' 7"  (1.702 m)   Wt 203 lb (92.1 kg)   BMI 31.79 kg/m    Appearance normal Oriented x3 Mood pleasant Gait normal   Tenderness over A1 pulley left ring finger Slight catching Normal strength Normal skin Normal color capillary refill No lymphadenopathy Normal sensation    Assessment and plan left ring finger tenosynovitis or trigger finger  Patient for left ring finger trigger finger release

## 2021-03-12 ENCOUNTER — Encounter (HOSPITAL_COMMUNITY): Payer: Self-pay | Admitting: Orthopedic Surgery

## 2021-03-12 ENCOUNTER — Other Ambulatory Visit: Payer: Self-pay | Admitting: Orthopedic Surgery

## 2021-03-12 ENCOUNTER — Telehealth: Payer: Self-pay | Admitting: Orthopedic Surgery

## 2021-03-12 ENCOUNTER — Ambulatory Visit (HOSPITAL_COMMUNITY): Admitting: Anesthesiology

## 2021-03-12 ENCOUNTER — Ambulatory Visit (HOSPITAL_COMMUNITY)
Admission: RE | Admit: 2021-03-12 | Discharge: 2021-03-12 | Disposition: A | Discharge status: Home / Self Care | Attending: Orthopedic Surgery | Admitting: Orthopedic Surgery

## 2021-03-12 ENCOUNTER — Encounter (HOSPITAL_COMMUNITY)
Admission: RE | Disposition: A | Discharge status: Home / Self Care | Payer: Self-pay | Source: Home / Self Care | Attending: Orthopedic Surgery

## 2021-03-12 DIAGNOSIS — G4730 Sleep apnea, unspecified: Secondary | ICD-10-CM | POA: Diagnosis not present

## 2021-03-12 DIAGNOSIS — I10 Essential (primary) hypertension: Secondary | ICD-10-CM | POA: Diagnosis not present

## 2021-03-12 DIAGNOSIS — M65342 Trigger finger, left ring finger: Secondary | ICD-10-CM | POA: Diagnosis not present

## 2021-03-12 HISTORY — PX: TRIGGER FINGER RELEASE: SHX641

## 2021-03-12 SURGERY — RELEASE, A1 PULLEY, FOR TRIGGER FINGER
Anesthesia: Regional | Site: Hand | Laterality: Left

## 2021-03-12 MED ORDER — LACTATED RINGERS IV SOLN
INTRAVENOUS | Status: DC
  Administered 2021-03-12: 1000 mL via INTRAVENOUS

## 2021-03-12 MED ORDER — SODIUM CHLORIDE 0.9 % IR SOLN
Status: DC | PRN
  Administered 2021-03-12: 1000 mL

## 2021-03-12 MED ORDER — FENTANYL CITRATE PF 50 MCG/ML IJ SOSY: 25.0000 ug | PREFILLED_SYRINGE | INTRAMUSCULAR | Status: DC | PRN

## 2021-03-12 MED ORDER — ONDANSETRON HCL 4 MG/2ML IJ SOLN: 4.0000 mg | Freq: Once | INTRAMUSCULAR | Status: DC | PRN

## 2021-03-12 MED ORDER — FENTANYL CITRATE (PF) 100 MCG/2ML IJ SOLN
INTRAMUSCULAR | Status: DC | PRN
  Administered 2021-03-12 (×3): 25 ug via INTRAVENOUS

## 2021-03-12 MED ORDER — MIDAZOLAM HCL 2 MG/2ML IJ SOLN
INTRAMUSCULAR | Status: DC | PRN
  Administered 2021-03-12 (×2): 1 mg via INTRAVENOUS

## 2021-03-12 MED ORDER — IBUPROFEN 400 MG PO TABS: 400.0000 mg | ORAL_TABLET | Freq: Three times a day (TID) | ORAL | 0 refills | Status: DC | PRN

## 2021-03-12 MED ORDER — HYDROCODONE-ACETAMINOPHEN 5-325 MG PO TABS: 1.0000 | ORAL_TABLET | ORAL | 0 refills | Status: AC | PRN

## 2021-03-12 MED ORDER — HYDROCODONE-ACETAMINOPHEN 5-325 MG PO TABS: 1.0000 | ORAL_TABLET | ORAL | 0 refills | Status: DC | PRN

## 2021-03-12 MED ORDER — FENTANYL CITRATE (PF) 100 MCG/2ML IJ SOLN
INTRAMUSCULAR | Status: AC
  Filled 2021-03-12: qty 2

## 2021-03-12 MED ORDER — MIDAZOLAM HCL 2 MG/2ML IJ SOLN
INTRAMUSCULAR | Status: AC
  Filled 2021-03-12: qty 2

## 2021-03-12 MED ORDER — BUPIVACAINE HCL (PF) 0.5 % IJ SOLN
INTRAMUSCULAR | Status: DC | PRN
  Administered 2021-03-12: 5 mL

## 2021-03-12 MED ORDER — ORAL CARE MOUTH RINSE: 15.0000 mL | Freq: Once | OROMUCOSAL | Status: AC

## 2021-03-12 MED ORDER — PROPOFOL 500 MG/50ML IV EMUL
INTRAVENOUS | Status: DC | PRN
  Administered 2021-03-12: 50 ug/kg/min via INTRAVENOUS

## 2021-03-12 MED ORDER — BUPIVACAINE HCL (PF) 0.5 % IJ SOLN
INTRAMUSCULAR | Status: AC
  Filled 2021-03-12: qty 30

## 2021-03-12 MED ORDER — LIDOCAINE HCL (PF) 0.5 % IJ SOLN
INTRAMUSCULAR | Status: DC | PRN
  Administered 2021-03-12: 50 mL via INTRAVENOUS

## 2021-03-12 MED ORDER — CHLORHEXIDINE GLUCONATE 0.12 % MT SOLN
15.0000 mL | Freq: Once | OROMUCOSAL | Status: AC
  Administered 2021-03-12: 15 mL via OROMUCOSAL
  Filled 2021-03-12: qty 15

## 2021-03-12 MED ORDER — CEFAZOLIN SODIUM-DEXTROSE 2-4 GM/100ML-% IV SOLN
2.0000 g | INTRAVENOUS | Status: AC
  Administered 2021-03-12: 2 g via INTRAVENOUS
  Filled 2021-03-12: qty 100

## 2021-03-12 MED ORDER — IBUPROFEN 400 MG PO TABS
400.0000 mg | ORAL_TABLET | Freq: Three times a day (TID) | ORAL | 0 refills | Status: DC | PRN
Start: 2021-03-12 — End: 2022-11-28

## 2021-03-12 MED ORDER — PROPOFOL 10 MG/ML IV BOLUS
INTRAVENOUS | Status: AC
  Filled 2021-03-12: qty 40

## 2021-03-12 MED ORDER — LIDOCAINE HCL (PF) 0.5 % IJ SOLN
INTRAMUSCULAR | Status: AC
  Filled 2021-03-12: qty 50

## 2021-03-12 MED ORDER — MEPERIDINE HCL 50 MG/ML IJ SOLN: 6.2500 mg | INTRAMUSCULAR | Status: DC | PRN

## 2021-03-12 MED ORDER — PROPOFOL 10 MG/ML IV BOLUS
INTRAVENOUS | Status: DC | PRN
  Administered 2021-03-12: 20 mg via INTRAVENOUS

## 2021-03-12 SURGICAL SUPPLY — 45 items
APL PRP STRL LF DISP 70% ISPRP (MISCELLANEOUS) ×1
BANDAGE ESMARK 4X12 BL STRL LF (DISPOSABLE) ×1 IMPLANT
BLADE SURG 15 STRL LF DISP TIS (BLADE) ×1 IMPLANT
BLADE SURG 15 STRL SS (BLADE) ×2
BNDG CMPR 12X4 ELC STRL LF (DISPOSABLE) ×1
BNDG CMPR STD VLCR NS LF 5.8X2 (GAUZE/BANDAGES/DRESSINGS) ×1
BNDG COHESIVE 3X5 TAN STRL LF (GAUZE/BANDAGES/DRESSINGS) ×2 IMPLANT
BNDG CONFORM 2 STRL LF (GAUZE/BANDAGES/DRESSINGS) ×2 IMPLANT
BNDG ELASTIC 2X5.8 VLCR NS LF (GAUZE/BANDAGES/DRESSINGS) ×2 IMPLANT
BNDG ESMARK 4X12 BLUE STRL LF (DISPOSABLE) ×2
CHLORAPREP W/TINT 26 (MISCELLANEOUS) ×2 IMPLANT
CLOTH BEACON ORANGE TIMEOUT ST (SAFETY) ×2 IMPLANT
COVER LIGHT HANDLE STERIS (MISCELLANEOUS) ×4 IMPLANT
COVER MAYO STAND STRL (DRAPES) ×1 IMPLANT
CUFF TOURN SGL QUICK 18X4 (TOURNIQUET CUFF) ×2 IMPLANT
DECANTER SPIKE VIAL GLASS SM (MISCELLANEOUS) ×2 IMPLANT
DRAPE HALF SHEET 40X57 (DRAPES) ×2 IMPLANT
ELECT NDL TIP 2.8 STRL (NEEDLE) ×1 IMPLANT
ELECT NEEDLE TIP 2.8 STRL (NEEDLE) ×2 IMPLANT
ELECT REM PT RETURN 9FT ADLT (ELECTROSURGICAL) ×2
ELECTRODE REM PT RTRN 9FT ADLT (ELECTROSURGICAL) ×1 IMPLANT
GAUZE 4X4 16PLY ~~LOC~~+RFID DBL (SPONGE) ×1 IMPLANT
GAUZE SPONGE 4X4 12PLY STRL (GAUZE/BANDAGES/DRESSINGS) ×2 IMPLANT
GAUZE XEROFORM 1X8 LF (GAUZE/BANDAGES/DRESSINGS) ×2 IMPLANT
GLOVE SS N UNI LF 8.5 STRL (GLOVE) ×2 IMPLANT
GLOVE SURG POLYISO LF SZ6.5 (GLOVE) ×1 IMPLANT
GLOVE SURG POLYISO LF SZ8 (GLOVE) ×2 IMPLANT
GLOVE SURG UNDER POLY LF SZ7 (GLOVE) ×4 IMPLANT
GOWN STRL REUS W/TWL LRG LVL3 (GOWN DISPOSABLE) ×2 IMPLANT
GOWN STRL REUS W/TWL XL LVL3 (GOWN DISPOSABLE) ×2 IMPLANT
KIT TURNOVER KIT A (KITS) ×2 IMPLANT
MANIFOLD NEPTUNE II (INSTRUMENTS) ×2 IMPLANT
NDL HYPO 18GX1.5 BLUNT FILL (NEEDLE) IMPLANT
NDL HYPO 21X1.5 SAFETY (NEEDLE) ×1 IMPLANT
NEEDLE HYPO 18GX1.5 BLUNT FILL (NEEDLE) ×2 IMPLANT
NEEDLE HYPO 21X1.5 SAFETY (NEEDLE) ×2 IMPLANT
NS IRRIG 1000ML POUR BTL (IV SOLUTION) ×2 IMPLANT
PACK BASIC LIMB (CUSTOM PROCEDURE TRAY) ×2 IMPLANT
PAD ARMBOARD 7.5X6 YLW CONV (MISCELLANEOUS) ×2 IMPLANT
POSITIONER HAND ALUMI XLG (MISCELLANEOUS) ×2 IMPLANT
SET BASIN LINEN APH (SET/KITS/TRAYS/PACK) ×2 IMPLANT
SPONGE GAUZE 2X2 8PLY STRL LF (GAUZE/BANDAGES/DRESSINGS) ×3 IMPLANT
SUT ETHILON 3 0 FSL (SUTURE) ×2 IMPLANT
SYR 30ML LL (SYRINGE) ×1 IMPLANT
SYR CONTROL 10ML LL (SYRINGE) ×2 IMPLANT

## 2021-03-12 NOTE — Op Note (Signed)
OPERATIVE REPORT   03/12/2021 8:15 AM Fuller Canada, MD   Preop diagnosis trigger finger left ring  Postop diagnosis same  Procedure release A1 pulley left ring finger  Surgeon Romeo Apple Anesthesia Bier block Operation findings: Stenosing tenosynovitis flexor tendon A1 pulley No assistants Counts were correct Clean case no specimen 10 mL of Marcaine with injected after the case Patient to recovery room patient's stable condition  The procedure was performed as follows  The patient was identified in the preop area and the surgical site was confirmed and marked, chart update was completed patient taken to surgery given appropriate antibiotics based on her allergy profile  After successful Bier block in sterile prep and drape timeout was completed  A longitudinal incision was made over the A1 pulley of the left ring finger subcutaneous tissue was divided blunt dissection was carried out to protect neurovascular structures. A blunt instrument was placed underneath the A1 pulley and the A1 pulley was released. Flexion extension of the digit confirmed removal of mechanical block. Wound was irrigated and closed with 3-0 nylon suture  We took the patient to recovery room in stable condition  Fuller Canada, MD

## 2021-03-12 NOTE — Brief Op Note (Signed)
03/12/2021  8:12 AM  PATIENT:  Jesse Roberts  60 y.o. male  PRE-OPERATIVE DIAGNOSIS:  left ring trigger finger  POST-OPERATIVE DIAGNOSIS:  left ring trigger finger  PROCEDURE:  Procedure(s): RELEASE TRIGGER FINGER/A-1 PULLEY (Left)  SURGEON:  Surgeon(s) and Role:    Vickki Hearing, MD - Primary  PHYSICIAN ASSISTANT:   ASSISTANTS: none   ANESTHESIA:   regional  EBL:  0 mL   BLOOD ADMINISTERED:none  DRAINS: none   LOCAL MEDICATIONS USED:  MARCAINE     SPECIMEN:  No Specimen  DISPOSITION OF SPECIMEN:  N/A  COUNTS:  YES  TOURNIQUET:   Total Tourniquet Time Documented: Upper Arm (Left) - 31 minutes Total: Upper Arm (Left) - 31 minutes   DICTATION: .Reubin Milan Dictation  PLAN OF CARE: Discharge to home after PACU  PATIENT DISPOSITION:  PACU - hemodynamically stable.   Delay start of Pharmacological VTE agent (>24hrs) due to surgical blood loss or risk of bleeding: no

## 2021-03-12 NOTE — Anesthesia Preprocedure Evaluation (Signed)
Anesthesia Evaluation  Patient identified by MRN, date of birth, ID band Patient awake    Reviewed: Allergy & Precautions, NPO status , Patient's Chart, lab work & pertinent test results  Airway Mallampati: II  TM Distance: >3 FB Neck ROM: Full    Dental  (+) Dental Advisory Given, Missing, Chipped   Pulmonary sleep apnea and Continuous Positive Airway Pressure Ventilation ,    Pulmonary exam normal breath sounds clear to auscultation       Cardiovascular Exercise Tolerance: Good hypertension, Pt. on medications Normal cardiovascular exam Rhythm:Regular Rate:Normal     Neuro/Psych PSYCHIATRIC DISORDERS Anxiety    GI/Hepatic negative GI ROS, Neg liver ROS,   Endo/Other  Hypothyroidism   Renal/GU      Musculoskeletal negative musculoskeletal ROS (+)   Abdominal   Peds  Hematology  (+) anemia ,   Anesthesia Other Findings   Reproductive/Obstetrics negative OB ROS                             Anesthesia Physical Anesthesia Plan  ASA: 2  Anesthesia Plan: Bier Block and General and Bier Block-LIDOCAINE ONLY   Post-op Pain Management:    Induction: Intravenous  PONV Risk Score and Plan: Propofol infusion and Ondansetron  Airway Management Planned: Nasal Cannula, Natural Airway and Simple Face Mask  Additional Equipment:   Intra-op Plan:   Post-operative Plan:   Informed Consent: I have reviewed the patients History and Physical, chart, labs and discussed the procedure including the risks, benefits and alternatives for the proposed anesthesia with the patient or authorized representative who has indicated his/her understanding and acceptance.     Dental advisory given  Plan Discussed with: CRNA and Surgeon  Anesthesia Plan Comments:         Anesthesia Quick Evaluation

## 2021-03-12 NOTE — Progress Notes (Signed)
Meds ordered this encounter  Medications   HYDROcodone-acetaminophen (NORCO/VICODIN) 5-325 MG tablet    Sig: Take 1 tablet by mouth every 4 (four) hours as needed for moderate pain.    Dispense:  20 tablet    Refill:  0   ibuprofen (ADVIL) 400 MG tablet    Sig: Take 1 tablet (400 mg total) by mouth every 8 (eight) hours as needed.    Dispense:  90 tablet    Refill:  0

## 2021-03-12 NOTE — Transfer of Care (Signed)
Immediate Anesthesia Transfer of Care Note  Patient: Jesse Roberts  Procedure(s) Performed: RELEASE TRIGGER FINGER/A-1 PULLEY (Left: Hand)  Patient Location: Short Stay  Anesthesia Type:General and Bier Block   Level of Consciousness: awake, alert  and oriented  Airway & Oxygen Therapy: Patient Spontanous Breathing  Post-op Assessment: Report given to RN and Post -op Vital signs reviewed and stable  Post vital signs: Reviewed and stable  Last Vitals:  Vitals Value Taken Time  BP    Temp    Pulse    Resp    SpO2      Last Pain:  Vitals:   03/12/21 0645  TempSrc: Oral  PainSc: 0-No pain      Patients Stated Pain Goal: 8 (03/12/21 0645)  Complications: No notable events documented.

## 2021-03-12 NOTE — Telephone Encounter (Signed)
I left message for patient told him will send Rx to [facility] pharmacy called [place] they can not fill outside prescriptions

## 2021-03-12 NOTE — Telephone Encounter (Signed)
Call received from one of the pharmacy contacts on file for patient, who is currently at [facility] for surgery: [facility], per [place], ph [phone], ext [phone] - [facility]ays [facility] [place] [facility] per Dr Romeo Apple at time of surgery today. States she has also left a message at [employer], as well as message left for patient, for pharmacy to be changed to other pharmacy on file. Please advise.

## 2021-03-12 NOTE — Anesthesia Postprocedure Evaluation (Signed)
Anesthesia Post Note  Patient: Jesse Roberts  Procedure(s) Performed: RELEASE TRIGGER FINGER/A-1 PULLEY (Left: Hand)  Patient location during evaluation: PACU Anesthesia Type: Bier Block Level of consciousness: awake and alert and oriented Pain management: pain level controlled Vital Signs Assessment: post-procedure vital signs reviewed and stable Respiratory status: spontaneous breathing and respiratory function stable Cardiovascular status: blood pressure returned to baseline and stable Postop Assessment: no apparent nausea or vomiting Anesthetic complications: no   No notable events documented.   Last Vitals:  Vitals:   03/12/21 0814 03/12/21 0829  BP: (!) 120/95 (!) 132/97  Pulse: 69   Resp: 18   Temp: 36.5 C   SpO2: 96%     Last Pain:  Vitals:   03/12/21 0814  TempSrc: Oral  PainSc: 0-No pain                 Carmine Youngberg C Layan Zalenski

## 2021-03-12 NOTE — Interval H&P Note (Signed)
History and Physical Interval Note:  03/12/2021 7:25 AM  Jesse Roberts  has presented today for surgery, with the diagnosis of left ring trigger finger.  The various methods of treatment have been discussed with the patient and family. After consideration of risks, benefits and other options for treatment, the patient has consented to  Procedure(s): RELEASE TRIGGER FINGER/A-1 PULLEY (Left)  RING FINGER as a surgical intervention.  The patient's history has been reviewed, patient examined, no change in status, stable for surgery.  I have reviewed the patient's chart and labs.  Questions were answered to the patient's satisfaction.     Fuller Canada

## 2021-03-12 NOTE — Anesthesia Procedure Notes (Addendum)
Anesthesia Regional Block: Bier block (IV Regional)   Pre-Anesthetic Checklist: , timeout performed,  Correct Patient, Correct Site, Correct Laterality,  Correct Procedure, Correct Position, site marked,  Risks and benefits discussed,  Surgical consent,  Pre-op evaluation,  At surgeon's request  Laterality: Left  Prep: alcohol swabs       Needles:  Injection technique: Single-shot       Needle Gauge: 22     Additional Needles:   Procedures:,,,,, intact distal pulses, Esmarch exsanguination,  Single tourniquet utilized    Narrative:  Start time: 03/12/2021 7:33 AM End time: 03/12/2021 7:36 AM  Performed by: Personally

## 2021-03-15 ENCOUNTER — Encounter (HOSPITAL_COMMUNITY): Payer: Self-pay | Admitting: Orthopedic Surgery

## 2021-03-24 ENCOUNTER — Encounter: Payer: Self-pay | Admitting: Orthopedic Surgery

## 2021-03-24 ENCOUNTER — Ambulatory Visit (INDEPENDENT_AMBULATORY_CARE_PROVIDER_SITE_OTHER): Admitting: Orthopedic Surgery

## 2021-03-24 ENCOUNTER — Other Ambulatory Visit: Payer: Self-pay

## 2021-03-24 DIAGNOSIS — F4312 Post-traumatic stress disorder, chronic: Secondary | ICD-10-CM | POA: Insufficient documentation

## 2021-03-24 DIAGNOSIS — M419 Scoliosis, unspecified: Secondary | ICD-10-CM | POA: Insufficient documentation

## 2021-03-24 DIAGNOSIS — M65342 Trigger finger, left ring finger: Secondary | ICD-10-CM

## 2021-03-24 NOTE — Progress Notes (Signed)
Postop visit #1 status post release of left ring finger for triggering  He has some swelling no redness stitches can be removed  He is not quite making a full fist yet so we will see him in 4 weeks to check on that

## 2021-04-21 ENCOUNTER — Encounter: Admitting: Orthopedic Surgery

## 2021-04-26 ENCOUNTER — Encounter: Payer: Self-pay | Admitting: Orthopedic Surgery

## 2021-04-26 ENCOUNTER — Ambulatory Visit (INDEPENDENT_AMBULATORY_CARE_PROVIDER_SITE_OTHER): Admitting: Orthopedic Surgery

## 2021-04-26 ENCOUNTER — Other Ambulatory Visit: Payer: Self-pay

## 2021-04-26 DIAGNOSIS — M65342 Trigger finger, left ring finger: Secondary | ICD-10-CM

## 2021-04-26 NOTE — Progress Notes (Signed)
Chief Complaint  Patient presents with   Post-op Follow-up    Hand left/ trigger finger release still has swelling 03/12/21   Encounter Diagnosis  Name Primary?   Trigger finger, left ring finger s/p release 03/12/21 Yes   Jesse Roberts had a trigger finger release on his left ring finger he still has swelling over the palm and he cannot quite get the finger all the way down lacking 10 degrees of flexion no pain no catching no locking  Exam shows no erythema or tenderness in the area flexor tendons are intact there is no catching he has smooth range of motion down to the last 10 degrees when he squeezes it he feels tightness and then he can get the fingers down to the palm  Recommend active range of motion  Since he has no catching or locking recommend follow-up as needed

## 2022-09-15 ENCOUNTER — Encounter: Payer: Self-pay | Admitting: Radiology

## 2022-11-11 ENCOUNTER — Emergency Department (HOSPITAL_COMMUNITY)
Admission: EM | Admit: 2022-11-11 | Discharge: 2022-11-11 | Disposition: A | Discharge status: Home / Self Care | Attending: Emergency Medicine | Admitting: Emergency Medicine

## 2022-11-11 ENCOUNTER — Encounter (HOSPITAL_COMMUNITY): Payer: Self-pay

## 2022-11-11 ENCOUNTER — Emergency Department (HOSPITAL_COMMUNITY)

## 2022-11-11 ENCOUNTER — Other Ambulatory Visit: Payer: Self-pay

## 2022-11-11 DIAGNOSIS — S8991XA Unspecified injury of right lower leg, initial encounter: Secondary | ICD-10-CM | POA: Insufficient documentation

## 2022-11-11 DIAGNOSIS — W010XXA Fall on same level from slipping, tripping and stumbling without subsequent striking against object, initial encounter: Secondary | ICD-10-CM | POA: Insufficient documentation

## 2022-11-11 DIAGNOSIS — E039 Hypothyroidism, unspecified: Secondary | ICD-10-CM | POA: Insufficient documentation

## 2022-11-11 DIAGNOSIS — I10 Essential (primary) hypertension: Secondary | ICD-10-CM | POA: Diagnosis not present

## 2022-11-11 DIAGNOSIS — Z79899 Other long term (current) drug therapy: Secondary | ICD-10-CM | POA: Diagnosis not present

## 2022-11-11 NOTE — ED Triage Notes (Signed)
Pt presents with a R knee injury after falling onto concrete. Pt reports difficulty bending the knee.

## 2022-11-11 NOTE — ED Provider Notes (Signed)
[place] EMERGENCY DEPARTMENT AT [facility] Provider Note  CSN: [id number] Arrival date & time: 11/11/22 1743  Chief Complaint(s) Knee Injury  HPI Jesse Roberts is a 62 y.o. male with history of hypertension presenting to the emergency department with right knee pain.  The patient reports that he tripped and landed on both knees.  His right knee hit pavement.  He reports pain in his right knee since the injury.  He has been able to ambulate with pain.  Also having pain with bending his knee.  Put Voltaren on it as well as some IcyHot which helped some.  He came to the emergency department because he wanted to check and see if it was broken.  No fevers or chills, nausea or vomiting or other medical complaints   Past Medical History Past Medical History:  Diagnosis Date   Anemia    Hypertension    Hypothyroidism    Sleep apnea    Thyroid disease    hypothyroidism   Patient Active Problem List   Diagnosis Date Noted   Scoliosis deformity of spine 03/24/2021   Chronic post-traumatic stress disorder 03/24/2021   Trigger finger, left ring finger s/p release 03/12/21    Erectile dysfunction 03/01/2021   Hypothyroidism 03/01/2021   Hypertension 03/01/2021   Posttraumatic stress disorder 03/01/2021   Mixed conductive and sensorineural hearing loss, unspecified 03/01/2021   Special screening for malignant neoplasms, colon    Trigger finger, left middle finger s/p release 08/02/18    Obstructive sleep apnea of adult 07/18/2004   Home Medication(s) Prior to Admission medications   Medication Sig Start Date End Date Taking? Authorizing Provider  cholecalciferol (VITAMIN D3) 25 MCG (1000 UNIT) tablet Take 1,000 Units by mouth daily.    [provider]  enalapril (VASOTEC) 5 MG tablet Take 5 mg by mouth daily.    [provider]  ibuprofen (ADVIL) 400 MG tablet Take 1 tablet (400 mg total) by mouth every 8 (eight) hours as needed. 03/12/21   Vickki Hearing,  MD  levothyroxine (SYNTHROID, LEVOTHROID) 75 MCG tablet Take 75 mcg by mouth daily before breakfast.    [provider]  magnesium oxide (MAG-OX) 400 MG tablet Take 400 mg by mouth daily.    [provider]  omega-3 fish oil (MAXEPA) 1000 MG CAPS capsule Take 1,000 mg by mouth daily.    [provider]  Potassium 99 MG TABS Take 99 mg by mouth daily.    [provider]  vitamin B-12 (CYANOCOBALAMIN) 1000 MCG tablet Take 1,000 mcg by mouth daily.    [provider]  vitamin E 180 MG (400 UNITS) capsule Take 400 Units by mouth daily.    [provider]  zinc gluconate 50 MG tablet Take 50 mg by mouth daily.    [provider]  Past Surgical History Past Surgical History:  Procedure Laterality Date   COLONOSCOPY N/A 03/29/2019   Procedure: COLONOSCOPY;  Surgeon: West Bali, MD;  Location: [facility];  Service: Endoscopy;  Laterality: N/A;  8:30   TRIGGER FINGER RELEASE Left 08/02/2018   Procedure: TRIGGER FINGER RELEASE LEFT LONG FINGER;  Surgeon: Vickki Hearing, MD;  Location: [facility];  Service: Orthopedics;  Laterality: Left;   TRIGGER FINGER RELEASE Left 03/12/2021   Procedure: RELEASE TRIGGER FINGER/A-1 PULLEY;  Surgeon: Vickki Hearing, MD;  Location: [facility];  Service: Orthopedics;  Laterality: Left;   Family History Family History  Problem Relation Age of Onset   Healthy Mother    COPD Father    Colon cancer Neg Hx     Social History Social History   Tobacco Use   Smoking status: Never   Smokeless tobacco: Never  Vaping Use   Vaping Use: Never used  Substance Use Topics   Alcohol use: Yes    Comment: socially   Drug use: No   Allergies Sildenafil  Review of Systems Review of Systems  All other systems reviewed and are negative.   Physical Exam Vital Signs  I  have reviewed the triage vital signs BP (!) 140/108 (BP Location: Right Arm)   Pulse 85   Temp 98.5 F (36.9 C) (Oral)   Resp 20   Ht 5\' 7"  (1.702 m)   Wt 93 kg   SpO2 99%   BMI 32.11 kg/m  Physical Exam Vitals and nursing note reviewed.  Constitutional:      General: He is not in acute distress.    Appearance: Normal appearance.  HENT:     Head: Normocephalic and atraumatic.     Mouth/Throat:     Mouth: Mucous membranes are moist.  Eyes:     Conjunctiva/sclera: Conjunctivae normal.  Cardiovascular:     Rate and Rhythm: Normal rate.  Pulmonary:     Effort: Pulmonary effort is normal. No respiratory distress.  Abdominal:     General: Abdomen is flat.  Musculoskeletal:     Comments: Tenderness over the anterior knee.  Small abrasion over the anterior knee.  No deformity.  Quadriceps and patellar tendons intact.  Possible slight effusion.  No ligamentous laxity.  Able to bear weight on both legs and take a few steps.  Skin:    General: Skin is warm and dry.     Capillary Refill: Capillary refill takes less than 2 seconds.  Neurological:     General: No focal deficit present.     Mental Status: He is alert. Mental status is at baseline.  Psychiatric:        Mood and Affect: Mood normal.        Behavior: Behavior normal.     ED Results and Treatments Labs (all labs ordered are listed, but only abnormal results are displayed) Labs Reviewed - No data to display  Radiology DG Knee Complete 4 Views Right  Result Date: 11/11/2022 CLINICAL DATA:  Trauma, fall, pain EXAM: RIGHT KNEE - COMPLETE 4+ VIEW COMPARISON:  None Available. FINDINGS: No recent fracture or dislocation is seen. There is 2.2 cm smooth marginated calcification in the anterior aspect of proximal tibia which may suggest old avulsion or ununited accessory ossification center. There is soft tissue  swelling in subcutaneous plane anterior to the proximal tibia. There is soft tissue fullness in suprapatellar bursa suggesting small to moderate effusion. Small bony spurs are seen in patellofemoral compartment. Possible tiny bony spurs are seen in medial and lateral compartments. IMPRESSION: No recent fracture or dislocation is seen. Degenerative changes with small bony spurs are noted. There is small to moderate effusion in suprapatellar bursa. Electronically Signed   By: Ernie Avena M.D.   On: 11/11/2022 18:54    Pertinent labs & imaging results that were available during my care of the patient were reviewed by me and considered in my medical decision making (see MDM for details).  Medications Ordered in ED Medications - No data to display                                                                                                                                   Procedures Procedures  (including critical care time)  Medical Decision Making / ED Course   MDM:  62 year old male presenting to the emergency department with knee pain.  Patient well-appearing, physical exam reassuring.  Suspect likely soft tissue injury from fall.  X-ray suggestive of possible traumatic bursitis.  No sign of tendon injury or fracture.  He is able to bear weight, extremely low concern for occult fracture.  Discussed self-care for symptoms at home.  He already follows with Dr. Romeo Apple orthopedist.  Provided Ace wrap and ice pack.  Patient declined pain medication in the emergency department.  Will discharge patient to home. All questions answered. Patient comfortable with plan of discharge. Return precautions discussed with patient and specified on the after visit summary.      Imaging Studies ordered: I ordered imaging studies including DG knee On my interpretation imaging demonstrates no fracture I independently visualized and interpreted imaging. I agree with the radiologist  interpretation   Medicines ordered and prescription drug management: No orders of the defined types were placed in this encounter.   -I have reviewed the patients home medicines and have made adjustments as needed  Reevaluation: After the interventions noted above, I reevaluated the patient and found that their symptoms have improved  Co morbidities that complicate the patient evaluation  Past Medical History:  Diagnosis Date   Anemia    Hypertension    Hypothyroidism    Sleep apnea    Thyroid disease    hypothyroidism      Dispostion: Disposition decision including need for hospitalization was considered, and patient discharged from emergency department.    Final Clinical Impression(s) /  ED Diagnoses Final diagnoses:  Injury of right knee, initial encounter     This chart was dictated using voice recognition software.  Despite best efforts to proofread,  errors can occur which can change the documentation meaning.    Lonell Grandchild, MD 11/11/22 [phone]

## 2022-11-11 NOTE — Discharge Instructions (Signed)
We evaluated you for your knee injury.  Your x-ray did not show any broken bones.  You did not have any sign of a tendon injury on your exam.  Your symptoms are most likely due to a soft tissue injury.  This can cause pain and swelling from bruising and inflammation.  It is possible that you have a ligament or meniscus injury, although I think this is probably less likely.  If your symptoms do not improve you may end up needing an MRI.  Please keep your leg elevated is much as possible, apply ice to help with swelling, and wear a soft knee brace for comfort.  You can also wrap your knee in an Ace wrap.  Please take Tylenol and Motrin for your symptoms at home.  You can take 1000 mg of Tylenol every 6 hours and 600 mg of ibuprofen every 6 hours as needed for your symptoms.  You can take these medicines together as needed, either at the same time, or alternating every 3 hours.  Please follow-up closely with Dr. Romeo Apple.  Please call his office to schedule appointment next week if possible.  Please return to the emergency department if you have any worsening symptoms such as inability to walk, fevers or chills, increased swelling, severe pain, or any other concerning symptoms.

## 2022-11-28 ENCOUNTER — Ambulatory Visit (INDEPENDENT_AMBULATORY_CARE_PROVIDER_SITE_OTHER): Admitting: Orthopedic Surgery

## 2022-11-28 ENCOUNTER — Encounter: Payer: Self-pay | Admitting: Orthopedic Surgery

## 2022-11-28 VITALS — BP 138/95 | HR 76 | Ht 67.0 in | Wt 200.0 lb

## 2022-11-28 DIAGNOSIS — S8001XA Contusion of right knee, initial encounter: Secondary | ICD-10-CM

## 2022-11-28 NOTE — Progress Notes (Signed)
   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 complains of pain and swelling  It is getting better but he is having trouble squatting and climbing steps sequentially    Past Medical History:  Diagnosis Date   Anemia    Hypertension    Hypothyroidism    Sleep apnea    Thyroid disease    hypothyroidism    BP (!) 138/95   Pulse 76   Ht 5\' 7"  (1.702 m)   Wt 200 lb (90.7 kg)   BMI 31.32 kg/m    General appearance: Well-developed well-nourished no gross deformities  Cardiovascular normal pulse and perfusion normal color without edema  Neurologically no sensation loss or deficits or pathologic reflexes  Psychological: Awake alert and oriented x3 mood and affect normal  Skin no lacerations or ulcerations no nodularity no palpable masses, no erythema or nodularity  Musculoskeletal: He has quite a bit of swelling over the prepatellar bursa there is tenderness there including quadriceps tendon he has excellent range of motion full extension and flexion stable posterior drawer test stable anterior drawer test no collateral ligament instability no meniscal signs  Imaging I interpret the images as follows  Plain films were negative for fracture but there was an effusion  A/P  Encounter Diagnosis  Name Primary?   Contusion of right knee, initial encounter Yes    Take Tylenol use ice continue increasing activity as tolerated  If necessary we can take him out for 3 weeks we expect 6 weeks for resolution if not we can do an MRI of the knee

## 2022-12-19 ENCOUNTER — Encounter: Payer: Self-pay | Admitting: Orthopedic Surgery

## 2022-12-19 ENCOUNTER — Ambulatory Visit (INDEPENDENT_AMBULATORY_CARE_PROVIDER_SITE_OTHER): Admitting: Orthopedic Surgery

## 2022-12-19 VITALS — BP 120/79 | HR 77

## 2022-12-19 DIAGNOSIS — S8001XD Contusion of right knee, subsequent encounter: Secondary | ICD-10-CM | POA: Diagnosis not present

## 2022-12-19 DIAGNOSIS — M7041 Prepatellar bursitis, right knee: Secondary | ICD-10-CM | POA: Diagnosis not present

## 2022-12-19 NOTE — Progress Notes (Signed)
Chief Complaint  Patient presents with   Knee Pain    Pain in right knee with knot gets hot to touch    Patella bursitis right knee follow-up after traumatic fall which increased swelling over the patellar tendon insertion tibial tubercle  Full range of motion no instability stable knee a little tender and warm over the prepatellar bursa  Recommend continue ibuprofen and diclofenac if it swells we can drain it we decided not to drain it today because it felt more like thickened bursa versus fluid

## 2022-12-22 ENCOUNTER — Ambulatory Visit: Admitting: Orthopedic Surgery

## 2022-12-23 ENCOUNTER — Emergency Department (HOSPITAL_COMMUNITY)
Admission: EM | Admit: 2022-12-23 | Discharge: 2022-12-23 | Disposition: A | Discharge status: Home / Self Care | Attending: Emergency Medicine | Admitting: Emergency Medicine

## 2022-12-23 ENCOUNTER — Other Ambulatory Visit: Payer: Self-pay

## 2022-12-23 ENCOUNTER — Emergency Department (HOSPITAL_COMMUNITY)

## 2022-12-23 ENCOUNTER — Encounter (HOSPITAL_COMMUNITY): Payer: Self-pay

## 2022-12-23 DIAGNOSIS — E039 Hypothyroidism, unspecified: Secondary | ICD-10-CM | POA: Insufficient documentation

## 2022-12-23 DIAGNOSIS — Z79899 Other long term (current) drug therapy: Secondary | ICD-10-CM | POA: Insufficient documentation

## 2022-12-23 DIAGNOSIS — I10 Essential (primary) hypertension: Secondary | ICD-10-CM | POA: Insufficient documentation

## 2022-12-23 DIAGNOSIS — S4992XA Unspecified injury of left shoulder and upper arm, initial encounter: Secondary | ICD-10-CM | POA: Diagnosis present

## 2022-12-23 DIAGNOSIS — X509XXA Other and unspecified overexertion or strenuous movements or postures, initial encounter: Secondary | ICD-10-CM | POA: Diagnosis not present

## 2022-12-23 NOTE — ED Provider Notes (Signed)
[place] EMERGENCY DEPARTMENT AT [facility] Provider Note   CSN: [id number] Arrival date & time: 12/23/22  1633     History  Chief Complaint  Patient presents with   Arm Injury    Jesse Roberts is a 62 y.o. male.   Arm Injury Associated symptoms: no fever        Jesse Roberts is a 62 y.o. male with a past medical history of hypertension and hypothyroidism who presents to the Emergency Department complaining of pain to his left upper arm.  States he was standing on a ladder when the ladder began to move he grabbed a rung of the ladder with his left hand and felt a snap to his upper arm.  He complains of pain near the elbow and a defect to his bicep.  He denies head injury, LOC, neck or back pain, no chest or abdominal pain.  No pain or injury of the lower extremities. Injury occurred at 12:30 today, he is right hand dominant   Home Medications Prior to Admission medications   Medication Sig Start Date End Date Taking? Authorizing Provider  enalapril (VASOTEC) 5 MG tablet Take 5 mg by mouth daily.    [provider]  levothyroxine (SYNTHROID, LEVOTHROID) 75 MCG tablet Take 75 mcg by mouth daily before breakfast.    [provider]  tadalafil (CIALIS) 20 MG tablet TAKE ONE TABLET BY MOUTH AS DIRECTED 1 HOUR BEFORE SEXUAL ACTIVITY.(NEW DOSE) DO NOT TAKE WITHIN 6 HOURS OF TERAZOSIN,PRAZOSIN,TAMSULOSIN OR DOXAZOSIN. DO NOT TAKE MORE THAN 1 DOSE WITHIN 24 HOURS. DO NOT TAKE WITH NITRATES Patient not taking: Reported on 12/19/2022 03/14/22   [provider]      Allergies    Sildenafil    Review of Systems   Review of Systems  Constitutional:  Negative for chills and fever.  Respiratory:  Negative for shortness of breath.   Cardiovascular:  Negative for chest pain and leg swelling.  Gastrointestinal:  Negative for nausea and vomiting.  Musculoskeletal:  Positive for myalgias (left upper arm pain and defect of the left bicep).  Skin:  Negative  for color change and wound.  Neurological:  Negative for dizziness, weakness, numbness and headaches.    Physical Exam Updated Vital Signs BP (!) 138/104 (BP Location: Right Arm)   Pulse 86   Temp 98.4 F (36.9 C) (Oral)   Resp 16   Ht 5\' 7"  (1.702 m)   Wt 90.7 kg   SpO2 97%   BMI 31.32 kg/m  Physical Exam Vitals and nursing note reviewed.  Constitutional:      General: He is not in acute distress.    Appearance: Normal appearance. He is not ill-appearing.  HENT:     Head: Atraumatic.  Cardiovascular:     Rate and Rhythm: Normal rate and regular rhythm.     Pulses: Normal pulses.  Pulmonary:     Effort: Pulmonary effort is normal.  Abdominal:     Palpations: Abdomen is soft.     Tenderness: There is no abdominal tenderness.  Musculoskeletal:        General: Tenderness, deformity and signs of injury present.     Left upper arm: Deformity and tenderness present. No swelling or bony tenderness.     Cervical back: Normal range of motion.     Comments: Step-off deformity noted just proximal to the elbow.  Patient unable to flex left bicep.  I cannot palpate distal bicep tendon.  Grip strength is strong  and symmetrical bilaterally.  Skin:    General: Skin is warm.     Capillary Refill: Capillary refill takes less than 2 seconds.     Findings: Erythema present. No bruising.  Neurological:     General: No focal deficit present.     Mental Status: He is alert.     Sensory: No sensory deficit.     Motor: No weakness.     ED Results / Procedures / Treatments   Labs (all labs ordered are listed, but only abnormal results are displayed) Labs Reviewed - No data to display  EKG None  Radiology DG Humerus Left  Result Date: 12/23/2022 CLINICAL DATA:  Fall, pain EXAM: LEFT HUMERUS - 2+ VIEW COMPARISON:  None Available. FINDINGS: There is no evidence of fracture or other focal bone lesions. Soft tissues are unremarkable. IMPRESSION: No fracture or dislocation of the left  humerus. Electronically Signed   By: Jearld Lesch M.D.   On: 12/23/2022 17:38    Procedures Procedures    Medications Ordered in ED Medications - No data to display  ED Course/ Medical Decision Making/ A&P                             Medical Decision Making Patient here for evaluation of left upper arm injury after he slipped from a ladder.  Grabbed a rung of the ladder with his left hand, felt a snap to his upper arm.  Muscle deformity noted at the left bicep.  Neurovascular intact.  Differential includes but not limited to fracture, dislocation, bicep tendon injury suspected.  Amount and/or Complexity of Data Reviewed Radiology: ordered.    Details: X-ray of the left humerus without acute bony findings  MRI left humerus, ordered at request of orthopedics, results pending Discussion of management or test interpretation with external provider(s):  Patient with suspected bicep tendon injury.  Neurovascular intact.  No bony injury per x-ray.  Consulted orthopedics, Dr. Dallas Schimke who recommends MRI here if available, sling and pt to f/u in clinic next week  MRI results pending, patient agreeable to plan with close orthopedic follow-up, sling ice and over-the-counter pain medications           Final Clinical Impression(s) / ED Diagnoses Final diagnoses:  Injury of left upper arm, initial encounter    Rx / DC Orders ED Discharge Orders     None         Rosey Bath 12/23/22 2225    Benjiman Core, MD 12/23/22 2333

## 2022-12-23 NOTE — Discharge Instructions (Signed)
Use a sling for comfort.  Apply ice packs on and off to your elbow area.  Call Dr. Dallas Schimke' office on Monday to arrange follow-up appointment

## 2022-12-23 NOTE — ED Notes (Signed)
Patient transported to MRI 

## 2022-12-23 NOTE — ED Triage Notes (Signed)
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  Pain to left bicep.

## 2022-12-27 ENCOUNTER — Ambulatory Visit (INDEPENDENT_AMBULATORY_CARE_PROVIDER_SITE_OTHER): Payer: Worker's Compensation | Admitting: Orthopedic Surgery

## 2022-12-27 ENCOUNTER — Encounter: Payer: Self-pay | Admitting: Orthopedic Surgery

## 2022-12-27 VITALS — BP 127/88 | HR 69 | Ht 67.0 in | Wt 205.0 lb

## 2022-12-27 DIAGNOSIS — S46212A Strain of muscle, fascia and tendon of other parts of biceps, left arm, initial encounter: Secondary | ICD-10-CM | POA: Diagnosis not present

## 2022-12-27 DIAGNOSIS — Z01818 Encounter for other preprocedural examination: Secondary | ICD-10-CM | POA: Diagnosis not present

## 2022-12-27 NOTE — H&P (View-Only) (Signed)
New Patient Visit  Assessment: Basel L Tait is a 62 y.o. male with the following: 1. Rupture of left distal biceps tendon, initial encounter  Plan: Javen L Markert sustained an injury to his left arm, and has ruptured his distal biceps tendon.  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  In order to restore his form of function, I am recommending operative repair of the left distal biceps tendon.  Procedure was discussed in detail.  All questions have been answered.  Risks and benefits of the surgery, including, but not limited to infection, bleeding, persistent pain, need for further surgery, injury, paresthesias, poor tendon healing, rerupture and more severe complications associated with anesthesia were discussed with the patient.  The patient has elected to proceed.  Surgery was scheduled for January 09, 2023.   Follow-up: Return for After surgery; DOS 01/09/23.  Subjective:  Chief Complaint  Patient presents with   Arm Pain    L arm injury DOI 12/23/22    History of Present Illness: Payne L Bordas is a 62 y.o. male who presents for evaluation of arm pain.  He is ambidextrous.  He uses both hands equally.  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  He was holding onto a tool in his right hand, and reached up to catch himself with his left arm.  He felt a sudden pop.  He presented to emergency department.  They are concerned for the biceps tendon rupture.  MRI has been obtained.  He is here to discuss the findings.  He is not wearing his sling today, but he was given 1 in the emergency department.  His pain is controlled.   Review of Systems: No fevers or chills No numbness or tingling No chest pain No shortness of breath No bowel or bladder dysfunction No GI distress No headaches   Medical History:  Past Medical History:  Diagnosis Date   Anemia    Hypertension    Hypothyroidism    Sleep apnea    Thyroid disease    hypothyroidism    Past Surgical  History:  Procedure Laterality Date   COLONOSCOPY N/A 03/29/2019   Procedure: COLONOSCOPY;  Surgeon: Fields, Sandi L, MD;  Location: [facility];  Service: Endoscopy;  Laterality: N/A;  8:30   TRIGGER FINGER RELEASE Left 08/02/2018   Procedure: TRIGGER FINGER RELEASE LEFT LONG FINGER;  Surgeon: Harrison, Stanley E, MD;  Location: [facility];  Service: Orthopedics;  Laterality: Left;   TRIGGER FINGER RELEASE Left 03/12/2021   Procedure: RELEASE TRIGGER FINGER/A-1 PULLEY;  Surgeon: Harrison, Stanley E, MD;  Location: [facility];  Service: Orthopedics;  Laterality: Left;    Family History  Problem Relation Age of Onset   Healthy Mother    COPD Father    Colon cancer Neg Hx    Social History   Tobacco Use   Smoking status: Never   Smokeless tobacco: Never  Vaping Use   Vaping Use: Never used  Substance Use Topics   Alcohol use: Yes    Comment: socially   Drug use: No    Allergies  Allergen Reactions   Sildenafil     Other reaction(s): Heartburn    Current Meds  Medication Sig   enalapril (VASOTEC) 5 MG tablet Take 5 mg by mouth daily.   levothyroxine (SYNTHROID, LEVOTHROID) 75 MCG tablet Take 75 mcg by mouth daily before breakfast.    Objective: BP 127/88   Pulse 69     Ht 5' 7" (1.702 m)   Wt 205 lb (93 kg)   BMI 32.11 kg/m   Physical Exam:  General: Alert and oriented. and No acute distress. Gait: Normal gait.  Evaluation left arm demonstrates bruising and ecchymosis within the antecubital fossa, extending medially.  Biceps tendon is not palpable.  Mild tenderness to palpation in the distal upper arm, and into the antecubital fossa.  Decreased resisted supination.  Fingers are warm well-perfused.  Sensation intact throughout the left upper extremity.   IMAGING: I personally reviewed images previously obtained from the ED  The left arm are negative from the emergency department.   Left arm MRI:  Impression:  IMPRESSION: 1. Full-thickness tear of the biceps tendon  with tendon retraction to the distal arm. 2. Soft tissue swelling and edema about the retracted tendon. 3. Marrow signal is within normal limits without evidence of fracture or dislocation.  New Medications:  No orders of the defined types were placed in this encounter.     Sherwood Castilla A Manas Hickling, MD  12/27/2022 1:54 PM   

## 2022-12-27 NOTE — Progress Notes (Signed)
New Patient Visit  Assessment: Jesse Roberts is a 62 y.o. male with the following: 1. Rupture of left distal biceps tendon, initial encounter  Plan: Jake Bathe sustained an injury to his left arm, and has ruptured his distal biceps tendon.  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  In order to restore his form of function, I am recommending operative repair of the left distal biceps tendon.  Procedure was discussed in detail.  All questions have been answered.  Risks and benefits of the surgery, including, but not limited to infection, bleeding, persistent pain, need for further surgery, injury, paresthesias, poor tendon healing, rerupture and more severe complications associated with anesthesia were discussed with the patient.  The patient has elected to proceed.  Surgery was scheduled for January 09, 2023.   Follow-up: Return for After surgery; DOS 01/09/23.  Subjective:  Chief Complaint  Patient presents with   Arm Pain    L arm injury DOI 12/23/22    History of Present Illness: Jesse Roberts is a 62 y.o. male who presents for evaluation of arm pain.  He is ambidextrous.  He uses both hands equally.  He is a Curator.  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  He was holding onto a tool in his right hand, and reached up to catch himself with his left arm.  He felt a sudden pop.  He presented to emergency department.  They are concerned for the biceps tendon rupture.  MRI has been obtained.  He is here to discuss the findings.  He is not wearing his sling today, but he was given 1 in the emergency department.  His pain is controlled.   Review of Systems: No fevers or chills No numbness or tingling No chest pain No shortness of breath No bowel or bladder dysfunction No GI distress No headaches   Medical History:  Past Medical History:  Diagnosis Date   Anemia    Hypertension    Hypothyroidism    Sleep apnea    Thyroid disease    hypothyroidism    Past Surgical  History:  Procedure Laterality Date   COLONOSCOPY N/A 03/29/2019   Procedure: COLONOSCOPY;  Surgeon: West Bali, MD;  Location: [facility];  Service: Endoscopy;  Laterality: N/A;  8:30   TRIGGER FINGER RELEASE Left 08/02/2018   Procedure: TRIGGER FINGER RELEASE LEFT LONG FINGER;  Surgeon: Vickki Hearing, MD;  Location: [facility];  Service: Orthopedics;  Laterality: Left;   TRIGGER FINGER RELEASE Left 03/12/2021   Procedure: RELEASE TRIGGER FINGER/A-1 PULLEY;  Surgeon: Vickki Hearing, MD;  Location: [facility];  Service: Orthopedics;  Laterality: Left;    Family History  Problem Relation Age of Onset   Healthy Mother    COPD Father    Colon cancer Neg Hx    Social History   Tobacco Use   Smoking status: Never   Smokeless tobacco: Never  Vaping Use   Vaping Use: Never used  Substance Use Topics   Alcohol use: Yes    Comment: socially   Drug use: No    Allergies  Allergen Reactions   Sildenafil     Other reaction(s): Heartburn    Current Meds  Medication Sig   enalapril (VASOTEC) 5 MG tablet Take 5 mg by mouth daily.   levothyroxine (SYNTHROID, LEVOTHROID) 75 MCG tablet Take 75 mcg by mouth daily before breakfast.    Objective: BP 127/88   Pulse 69  Ht 5\' 7"  (1.702 m)   Wt 205 lb (93 kg)   BMI 32.11 kg/m   Physical Exam:  General: Alert and oriented. and No acute distress. Gait: Normal gait.  Evaluation left arm demonstrates bruising and ecchymosis within the antecubital fossa, extending medially.  Biceps tendon is not palpable.  Mild tenderness to palpation in the distal upper arm, and into the antecubital fossa.  Decreased resisted supination.  Fingers are warm well-perfused.  Sensation intact throughout the left upper extremity.   IMAGING: I personally reviewed images previously obtained from the ED  The left arm are negative from the emergency department.   Left arm MRI:  Impression:  IMPRESSION: 1. Full-thickness tear of the biceps tendon  with tendon retraction to the distal arm. 2. Soft tissue swelling and edema about the retracted tendon. 3. Marrow signal is within normal limits without evidence of fracture or dislocation.  New Medications:  No orders of the defined types were placed in this encounter.     Oliver Barre, MD  12/27/2022 1:54 PM

## 2022-12-28 ENCOUNTER — Telehealth: Payer: Self-pay | Admitting: Radiology

## 2022-12-28 NOTE — Telephone Encounter (Signed)
Patient called, said he went back to work in [employer].  His employer says they need a work note for him to be there, light duty if he is going to be there.  He is ok too if you write him out of work.  Please call him and let him know which?  He can get the letter once generated from MyChart.  But please do call him to advise on what Dr Dallas Schimke recommends.

## 2023-01-03 ENCOUNTER — Telehealth: Payer: Self-pay | Admitting: Orthopedic Surgery

## 2023-01-03 NOTE — Telephone Encounter (Signed)
Dr. Dallas Schimke pt - pt lvm stating he sees his surgery on mychart, but no time.  He wants a call back with his surgery time.  [phone]

## 2023-01-03 NOTE — Telephone Encounter (Signed)
Called pt to see if he'd been scheduled for his pre op, pt states he hasn't heard anything from anyone yet. Called to [facility] stay to see if we could get pt scheduled. They will give him a call to get him prepared for surgery.

## 2023-01-04 NOTE — Patient Instructions (Signed)
Jesse Roberts  01/04/2023     @PREFPERIOPPHARMACY @   Your procedure is scheduled on 01/09/2023.  Report to [facility] at 10:00 A.M.  Call this number if you have problems the morning of surgery:  [phone]  If you experience any cold or flu symptoms such as cough, fever, chills, shortness of breath, etc. between now and your scheduled surgery, please notify us at the above number.   Remember:   Do not eat or drink after midnight.                      Take these medicines the morning of surgery with A SIP OF WATER : Vasotec Synthroid   Do not eat anything after midnight.    Please drink your ERAS drink at 8:00 am the morning of the procedure. Nothing else to drink after that.     Do not wear jewelry, make-up or nail polish, including gel polish,  artificial nails, or any other type of covering on natural nails (fingers and  toes).  Do not wear lotions, powders, or perfumes, or deodorant.  Do not shave 48 hours prior to surgery.  Men may shave face and neck.  Do not bring valuables to the hospital.  [facility] is not responsible for any belongings or valuables.  Contacts, dentures or bridgework may not be worn into surgery.  Leave your suitcase in the car.  After surgery it may be brought to your room.  For patients admitted to the hospital, discharge time will be determined by your treatment team.  Patients discharged the day of surgery will not be allowed to drive home.   Name and phone number of your driver:   Family Special instructions:  N/A  Please read over the following fact sheets that you were given. Care and Recovery After Surgery   Distal Biceps Tendon Tear Repair  Distal biceps tendon tear repair is a surgical procedure to reattach the distal biceps tendon to a bone in the elbow (radius). The distal biceps tendon is a strong cord of tissue that attaches the biceps muscle to the radius. When the distal biceps tendon tears, the biceps muscle separates from  the radius. This can affect your ability to turn your hand palm-up and bend your elbow. The goal of this procedure is to reattach the tendon to the radius and to help the biceps muscle function normally. Tell a health care provider about: Any allergies you have. All medicines you are taking, including vitamins, herbs, eye drops, creams, and over-the-counter medicines. Any problems you or family members have had with anesthesia. Any bleeding problems you have. Any surgeries you have had. Any medical conditions you have. Whether you are pregnant or may be pregnant. What are the risks? Your health care provider will talk with you about risks. These may include: Infection. Bleeding. Allergic reactions to medicines. Damage to other structures, such as nerves in the arm. This could cause an inability to move the hand, forearm, or elbow (paralysis). A tear that happens again (recurrence). Stiffness or decreased range of motion in the wrist, forearm, or elbow. What happens before the procedure? When to stop eating and drinking Follow instructions from your health care provider about what you may eat and drink. These may include: 8 hours before your procedure Stop eating most foods. Do not eat meat, fried foods, or fatty foods. Eat only light foods, such as toast or crackers. All liquids are okay except energy drinks and alcohol.  6 hours before your procedure Stop eating. Drink only clear liquids, such as water, clear fruit juice, black coffee, plain tea, and sports drinks. Do not drink energy drinks or alcohol. 2 hours before your procedure Stop drinking all liquids. You may be allowed to take medicines with small sips of water. If you do not follow your health care provider's instructions, your procedure may be delayed or canceled. Medicines Ask your health care provider about: Changing or stopping your regular medicines. These include any diabetes medicines or blood thinners you  take. Taking medicines such as aspirin and ibuprofen. These medicines can thin your blood. Do not take them unless your health care provider tells you to. Taking over-the-counter medicines, vitamins, herbs, and supplements. Surgery safety Ask your health care provider: How your surgery site will be marked. What steps will be taken to help prevent infection. These steps may include: Removing hair at the surgery site. Washing skin with a soap that kills germs. Taking antibiotics. General instructions You may have a physical exam of your arm. You may have imaging tests, including X-rays, MRI, or ultrasound. You may also have blood or urine tests. If you will be going home right after the procedure, plan to have a responsible adult: Take you home from the hospital or clinic. You will not be allowed to drive. Care for you for the time you are told. What happens during the procedure? An IV will be inserted into one of your veins. You may be given: A sedative. This helps you relax. Anesthesia. This will: Numb certain areas of your body. Make you fall asleep for surgery. An incision will be made in the front of your elbow. Another incision may be made in the outer side of your elbow, depending on which technique your surgeon uses. Your tendon will be attached to your radius with one of the following methods: If only one incision is made in your elbow, screws (bone anchors) will be inserted into your radius through the incision. Your tendon will be attached to your radius using surgical stitches (sutures) that are sewn onto the bone anchors. If two incisions are made in your elbow, surgical instruments will be inserted through the incision in your outer elbow. A small hole (trough) will be made in your radius. Your tendon will be attached to your radius with sutures that are threaded through the trough. Your incision or incisions will be closed using sutures, skin glue, or adhesive tape. Your  incision or incisions may be covered with a bandage (dressing). A brace, splint, or cast may be applied to your elbow to keep it in place for a period of time (immobilization). The procedure may vary among health care providers and hospitals. What happens after the procedure? Your blood pressure, heart rate, breathing rate, and blood oxygen level will be monitored until you leave the hospital or clinic. You will have some pain. You will be given medicine to treat the pain. You may go home right after the procedure. You may be given a sling to support your arm and relieve discomfort. Do not drive or operate machinery until your health care provider says that it is safe. You may be seen by a physical therapist. This information is not intended to replace advice given to you by your health care provider. Make sure you discuss any questions you have with your health care provider. Document Revised: 11/29/2021 Document Reviewed: 11/29/2021 Elsevier Patient Education  2024 [employer]   General Anesthesia, Adult General anesthesia is  the use of medicine to make you fall asleep (unconscious) for a medical procedure. General anesthesia must be used for certain procedures. It is often recommended for surgery or procedures that: Last a long time. Require you to be still or in an unusual position. Are major and can cause blood loss. Affect your breathing. The medicines used for general anesthesia are called general anesthetics. During general anesthesia, these medicines are given along with medicines that: Prevent pain. Control your blood pressure. Relax your muscles. Prevent nausea and vomiting after the procedure. Tell a health care provider about: Any allergies you have. All medicines you are taking, including vitamins, herbs, eye drops, creams, and over-the-counter medicines. Your history of any: Medical conditions you have, including: High blood pressure. Bleeding  problems. Diabetes. Heart or lung conditions, such as: Heart failure. Sleep apnea. Asthma. Chronic obstructive pulmonary disease (COPD). Current or recent illnesses, such as: Upper respiratory, chest, or ear infections. Cough or fever. Tobacco or drug use, including marijuana or alcohol use. Depression or anxiety. Surgeries and types of anesthetics you have had. Problems you or family members have had with anesthetic medicines. Whether you are pregnant or may be pregnant. Whether you have any chipped or loose teeth, dentures, caps, bridgework, or issues with your mouth, swallowing, or choking. What are the risks? Your health care provider will talk with you about risks. These may include: Allergic reaction to the medicines. Lung and heart problems. Inhaling food or liquid from the stomach into the lungs (aspiration). Nerve injury. Injury to the lips, mouth, teeth, or gums. Stroke. Waking up during your procedure and being unable to move. This is rare. These problems are more likely to develop if you are having a major surgery or if you have an advanced or serious medical condition. You can prevent some of these complications by answering all of your health care provider's questions thoroughly and by following all instructions before your procedure. General anesthesia can cause side effects, including: Nausea or vomiting. A sore throat or hoarseness from the breathing tube. Wheezing or coughing. Shaking chills or feeling cold. Body aches. Sleepiness. Confusion, agitation (delirium), or anxiety. What happens before the procedure? When to stop eating and drinking Follow instructions from your health care provider about what you may eat and drink before your procedure. If you do not follow your health care provider's instructions, your procedure may be delayed or canceled. Medicines Ask your health care provider about: Changing or stopping your regular medicines. These include any  diabetes medicines or blood thinners you take. Taking medicines such as aspirin and ibuprofen. These medicines can thin your blood. Do not take them unless your health care provider tells you to. Taking over-the-counter medicines, vitamins, herbs, and supplements. General instructions Do not use any products that contain nicotine or tobacco for at least 4 weeks before the procedure. These products include cigarettes, chewing tobacco, and vaping devices, such as e-cigarettes. If you need help quitting, ask your health care provider. If you brush your teeth on the morning of the procedure, make sure to spit out all of the water and toothpaste. If told by your health care provider, bring your sleep apnea device with you to surgery (if applicable). If you will be going home right after the procedure, plan to have a responsible adult: Take you home from the hospital or clinic. You will not be allowed to drive. Care for you for the time you are told. What happens during the procedure?  An IV will be inserted into  one of your veins. You will be given one or more of the following through a face mask or IV: A sedative. This helps you relax. Anesthesia. This will: Numb certain areas of your body. Make you fall asleep for surgery. After you are unconscious, a breathing tube may be inserted down your throat to help you breathe. This will be removed before you wake up. An anesthesia provider, such as an anesthesiologist, will stay with you throughout your procedure. The anesthesia provider will: Keep you comfortable and safe by continuing to give you medicines and adjusting the amount of medicine that you get. Monitor your blood pressure, heart rate, and oxygen levels to make sure that the anesthetics do not cause any problems. The procedure may vary among health care providers and hospitals. What happens after the procedure? Your blood pressure, temperature, heart rate, breathing rate, and blood oxygen  level will be monitored until you leave the hospital or clinic. You will wake up in a recovery area. You may wake up slowly. You may be given medicine to help you with pain, nausea, or any other side effects from the anesthesia. Summary General anesthesia is the use of medicine to make you fall asleep (unconscious) for a medical procedure. Follow your health care provider's instructions about when to stop eating, drinking, or taking certain medicines before your procedure. Plan to have a responsible adult take you home from the hospital or clinic. This information is not intended to replace advice given to you by your health care provider. Make sure you discuss any questions you have with your health care provider. Document Revised: 09/30/2021 Document Reviewed: 09/30/2021 Elsevier Patient Education  2024 [employer] Inc[employer]   How to Use Chlorhexidine Before Surgery Chlorhexidine gluconate (CHG) is a germ-killing (antiseptic) solution that is used to clean the skin. It can get rid of the bacteria that normally live on the skin and can keep them away for about 24 hours. To clean your skin with CHG, you may be given: A CHG solution to use in the shower or as part of a sponge bath. A prepackaged cloth that contains CHG. Cleaning your skin with CHG may help lower the risk for infection: While you are staying in the intensive care unit of the hospital. If you have a vascular access, such as a central line, to provide short-term or long-term access to your veins. If you have a catheter to drain urine from your bladder. If you are on a ventilator. A ventilator is a machine that helps you breathe by moving air in and out of your lungs. After surgery. What are the risks? Risks of using CHG include: A skin reaction. Hearing loss, if CHG gets in your ears and you have a perforated eardrum. Eye injury, if CHG gets in your eyes and is not rinsed out. The CHG product catching fire. Make sure that you avoid  smoking and flames after applying CHG to your skin. Do not use CHG: If you have a chlorhexidine allergy or have previously reacted to chlorhexidine. On babies younger than 7 months of age. How to use CHG solution Use CHG only as told by your health care provider, and follow the instructions on the label. Use the full amount of CHG as directed. Usually, this is one bottle. During a shower Follow these steps when using CHG solution during a shower (unless your health care provider gives you different instructions): Start the shower. Use your normal soap and shampoo to wash your face and hair. Turn  off the shower or move out of the shower stream. Pour the CHG onto a clean washcloth. Do not use any type of brush or rough-edged sponge. Starting at your neck, lather your body down to your toes. Make sure you follow these instructions: If you will be having surgery, pay special attention to the part of your body where you will be having surgery. Scrub this area for at least 1 minute. Do not use CHG on your head or face. If the solution gets into your ears or eyes, rinse them well with water. Avoid your genital area. Avoid any areas of skin that have broken skin, cuts, or scrapes. Scrub your back and under your arms. Make sure to wash skin folds. Let the lather sit on your skin for 1-2 minutes or as long as told by your health care provider. Thoroughly rinse your entire body in the shower. Make sure that all body creases and crevices are rinsed well. Dry off with a clean towel. Do not put any substances on your body afterward--such as powder, lotion, or perfume--unless you are told to do so by your health care provider. Only use lotions that are recommended by the manufacturer. Put on clean clothes or pajamas. If it is the night before your surgery, sleep in clean sheets.  During a sponge bath Follow these steps when using CHG solution during a sponge bath (unless your health care provider gives  you different instructions): Use your normal soap and shampoo to wash your face and hair. Pour the CHG onto a clean washcloth. Starting at your neck, lather your body down to your toes. Make sure you follow these instructions: If you will be having surgery, pay special attention to the part of your body where you will be having surgery. Scrub this area for at least 1 minute. Do not use CHG on your head or face. If the solution gets into your ears or eyes, rinse them well with water. Avoid your genital area. Avoid any areas of skin that have broken skin, cuts, or scrapes. Scrub your back and under your arms. Make sure to wash skin folds. Let the lather sit on your skin for 1-2 minutes or as long as told by your health care provider. Using a different clean, wet washcloth, thoroughly rinse your entire body. Make sure that all body creases and crevices are rinsed well. Dry off with a clean towel. Do not put any substances on your body afterward--such as powder, lotion, or perfume--unless you are told to do so by your health care provider. Only use lotions that are recommended by the manufacturer. Put on clean clothes or pajamas. If it is the night before your surgery, sleep in clean sheets. How to use CHG prepackaged cloths Only use CHG cloths as told by your health care provider, and follow the instructions on the label. Use the CHG cloth on clean, dry skin. Do not use the CHG cloth on your head or face unless your health care provider tells you to. When washing with the CHG cloth: Avoid your genital area. Avoid any areas of skin that have broken skin, cuts, or scrapes. Before surgery Follow these steps when using a CHG cloth to clean before surgery (unless your health care provider gives you different instructions): Using the CHG cloth, vigorously scrub the part of your body where you will be having surgery. Scrub using a back-and-forth motion for 3 minutes. The area on your body should be  completely wet with CHG when you  are done scrubbing. Do not rinse. Discard the cloth and let the area air-dry. Do not put any substances on the area afterward, such as powder, lotion, or perfume. Put on clean clothes or pajamas. If it is the night before your surgery, sleep in clean sheets.  For general bathing Follow these steps when using CHG cloths for general bathing (unless your health care provider gives you different instructions). Use a separate CHG cloth for each area of your body. Make sure you wash between any folds of skin and between your fingers and toes. Wash your body in the following order, switching to a new cloth after each step: The front of your neck, shoulders, and chest. Both of your arms, under your arms, and your hands. Your stomach and groin area, avoiding the genitals. Your right leg and foot. Your left leg and foot. The back of your neck, your back, and your buttocks. Do not rinse. Discard the cloth and let the area air-dry. Do not put any substances on your body afterward--such as powder, lotion, or perfume--unless you are told to do so by your health care provider. Only use lotions that are recommended by the manufacturer. Put on clean clothes or pajamas. Contact a health care provider if: Your skin gets irritated after scrubbing. You have questions about using your solution or cloth. You swallow any chlorhexidine. Call your local poison control center (434-489-5861 in the [place] Get help right away if: Your eyes itch badly, or they become very red or swollen. Your skin itches badly and is red or swollen. Your hearing changes. You have trouble seeing. You have swelling or tingling in your mouth or throat. You have trouble breathing. These symptoms may represent a serious problem that is an emergency. Do not wait to see if the symptoms will go away. Get medical help right away. Call your local emergency services (911 in the [place] Do not drive yourself to the  hospital. Summary Chlorhexidine gluconate (CHG) is a germ-killing (antiseptic) solution that is used to clean the skin. Cleaning your skin with CHG may help to lower your risk for infection. You may be given CHG to use for bathing. It may be in a bottle or in a prepackaged cloth to use on your skin. Carefully follow your health care provider's instructions and the instructions on the product label. Do not use CHG if you have a chlorhexidine allergy. Contact your health care provider if your skin gets irritated after scrubbing. This information is not intended to replace advice given to you by your health care provider. Make sure you discuss any questions you have with your health care provider. Document Revised: 11/01/2021 Document Reviewed: 09/14/2020 Elsevier Patient Education  2023 [employer].

## 2023-01-05 ENCOUNTER — Encounter (HOSPITAL_COMMUNITY): Payer: Self-pay

## 2023-01-05 ENCOUNTER — Encounter (HOSPITAL_COMMUNITY)
Admission: RE | Admit: 2023-01-05 | Discharge: 2023-01-05 | Disposition: A | Discharge status: Home / Self Care | Source: Ambulatory Visit | Attending: Orthopedic Surgery | Admitting: Orthopedic Surgery

## 2023-01-05 DIAGNOSIS — Z01818 Encounter for other preprocedural examination: Secondary | ICD-10-CM

## 2023-01-05 DIAGNOSIS — Z01812 Encounter for preprocedural laboratory examination: Secondary | ICD-10-CM | POA: Insufficient documentation

## 2023-01-05 LAB — CBC
HCT: 43.7 % (ref 39.0–52.0)
Hemoglobin: 15.1 g/dL (ref 13.0–17.0)
MCH: 31.4 pg (ref 26.0–34.0)
MCHC: 34.6 g/dL (ref 30.0–36.0)
MCV: 90.9 fL (ref 80.0–100.0)
Platelets: 373 10*3/uL (ref 150–400)
RBC: 4.81 MIL/uL (ref 4.22–5.81)
RDW: 12.2 % (ref 11.5–15.5)
WBC: 7.6 10*3/uL (ref 4.0–10.5)
nRBC: 0 % (ref 0.0–0.2)

## 2023-01-05 LAB — BASIC METABOLIC PANEL
Anion gap: 12 (ref 5–15)
BUN: 13 mg/dL (ref 8–23)
CO2: 20 mmol/L — ABNORMAL LOW (ref 22–32)
Calcium: 8.8 mg/dL — ABNORMAL LOW (ref 8.9–10.3)
Chloride: 104 mmol/L (ref 98–111)
Creatinine, Ser: 0.87 mg/dL (ref 0.61–1.24)
GFR, Estimated: >60 mL/min (ref >60)
Glucose, Bld: 98 mg/dL (ref 70–99)
Potassium: 3.7 mmol/L (ref 3.5–5.1)
Sodium: 136 mmol/L (ref 135–145)

## 2023-01-09 ENCOUNTER — Encounter (HOSPITAL_COMMUNITY)
Admission: RE | Disposition: A | Discharge status: Home / Self Care | Payer: Self-pay | Source: Home / Self Care | Attending: Orthopedic Surgery

## 2023-01-09 ENCOUNTER — Ambulatory Visit (HOSPITAL_BASED_OUTPATIENT_CLINIC_OR_DEPARTMENT_OTHER): Payer: Worker's Compensation | Admitting: Registered Nurse

## 2023-01-09 ENCOUNTER — Other Ambulatory Visit: Payer: Self-pay

## 2023-01-09 ENCOUNTER — Encounter (HOSPITAL_COMMUNITY): Payer: Self-pay | Admitting: Orthopedic Surgery

## 2023-01-09 ENCOUNTER — Ambulatory Visit (HOSPITAL_COMMUNITY)
Admission: RE | Admit: 2023-01-09 | Discharge: 2023-01-09 | Disposition: A | Discharge status: Home / Self Care | Payer: Worker's Compensation | Attending: Orthopedic Surgery | Admitting: Orthopedic Surgery

## 2023-01-09 ENCOUNTER — Ambulatory Visit (HOSPITAL_COMMUNITY): Payer: Worker's Compensation | Admitting: Registered Nurse

## 2023-01-09 ENCOUNTER — Ambulatory Visit (HOSPITAL_COMMUNITY)

## 2023-01-09 DIAGNOSIS — E039 Hypothyroidism, unspecified: Secondary | ICD-10-CM | POA: Insufficient documentation

## 2023-01-09 DIAGNOSIS — S46212A Strain of muscle, fascia and tendon of other parts of biceps, left arm, initial encounter: Secondary | ICD-10-CM

## 2023-01-09 DIAGNOSIS — G4730 Sleep apnea, unspecified: Secondary | ICD-10-CM | POA: Insufficient documentation

## 2023-01-09 DIAGNOSIS — F419 Anxiety disorder, unspecified: Secondary | ICD-10-CM

## 2023-01-09 DIAGNOSIS — I10 Essential (primary) hypertension: Secondary | ICD-10-CM | POA: Diagnosis not present

## 2023-01-09 DIAGNOSIS — X58XXXA Exposure to other specified factors, initial encounter: Secondary | ICD-10-CM | POA: Diagnosis not present

## 2023-01-09 HISTORY — PX: DISTAL BICEPS TENDON REPAIR: SHX1461

## 2023-01-09 SURGERY — REPAIR, TENDON, BICEPS, DISTAL
Anesthesia: General | Site: Arm Lower | Laterality: Left

## 2023-01-09 MED ORDER — PROPOFOL 10 MG/ML IV BOLUS
INTRAVENOUS | Status: DC | PRN
  Administered 2023-01-09: 200 mg via INTRAVENOUS

## 2023-01-09 MED ORDER — MIDAZOLAM HCL 2 MG/2ML IJ SOLN
INTRAMUSCULAR | Status: AC
  Filled 2023-01-09: qty 2

## 2023-01-09 MED ORDER — ONDANSETRON HCL 4 MG PO TABS: 4.0000 mg | ORAL_TABLET | Freq: Three times a day (TID) | ORAL | 0 refills | Status: AC | PRN

## 2023-01-09 MED ORDER — OXYCODONE HCL 5 MG PO TABS: 5.0000 mg | ORAL_TABLET | ORAL | 0 refills | Status: AC | PRN

## 2023-01-09 MED ORDER — MIDAZOLAM HCL 5 MG/5ML IJ SOLN
INTRAMUSCULAR | Status: DC | PRN
  Administered 2023-01-09: 2 mg via INTRAVENOUS

## 2023-01-09 MED ORDER — FENTANYL CITRATE (PF) 100 MCG/2ML IJ SOLN
INTRAMUSCULAR | Status: AC
  Filled 2023-01-09: qty 2

## 2023-01-09 MED ORDER — ONDANSETRON HCL 4 MG/2ML IJ SOLN
INTRAMUSCULAR | Status: AC
  Filled 2023-01-09: qty 2

## 2023-01-09 MED ORDER — SUGAMMADEX SODIUM 200 MG/2ML IV SOLN
INTRAVENOUS | Status: DC | PRN
  Administered 2023-01-09: 200 mg via INTRAVENOUS

## 2023-01-09 MED ORDER — FENTANYL CITRATE PF 50 MCG/ML IJ SOSY
25.0000 ug | PREFILLED_SYRINGE | INTRAMUSCULAR | Status: DC | PRN
  Administered 2023-01-09: 50 ug via INTRAVENOUS
  Filled 2023-01-09: qty 1

## 2023-01-09 MED ORDER — DEXMEDETOMIDINE HCL IN NACL 200 MCG/50ML IV SOLN
INTRAVENOUS | Status: DC | PRN
  Administered 2023-01-09: 20 ug via INTRAVENOUS

## 2023-01-09 MED ORDER — SODIUM CHLORIDE 0.9 % IR SOLN
Status: DC | PRN
  Administered 2023-01-09: 1000 mL

## 2023-01-09 MED ORDER — BUPIVACAINE-EPINEPHRINE (PF) 0.25% -1:200000 IJ SOLN
INTRAMUSCULAR | Status: AC
  Filled 2023-01-09: qty 30

## 2023-01-09 MED ORDER — DEXTROSE 5 % IV SOLN
INTRAVENOUS | Status: DC | PRN
  Administered 2023-01-09: 3 g via INTRAVENOUS

## 2023-01-09 MED ORDER — PROPOFOL 10 MG/ML IV BOLUS
INTRAVENOUS | Status: AC
  Filled 2023-01-09: qty 20

## 2023-01-09 MED ORDER — CELECOXIB 100 MG PO CAPS: 100.0000 mg | ORAL_CAPSULE | Freq: Every day | ORAL | 0 refills | Status: AC

## 2023-01-09 MED ORDER — ACETAMINOPHEN 500 MG PO TABS: 1000.0000 mg | ORAL_TABLET | Freq: Three times a day (TID) | ORAL | 0 refills | Status: AC

## 2023-01-09 MED ORDER — FENTANYL CITRATE (PF) 100 MCG/2ML IJ SOLN
INTRAMUSCULAR | Status: DC | PRN
  Administered 2023-01-09 (×6): 50 ug via INTRAVENOUS

## 2023-01-09 MED ORDER — LIDOCAINE HCL (CARDIAC) PF 100 MG/5ML IV SOSY
PREFILLED_SYRINGE | INTRAVENOUS | Status: DC | PRN
  Administered 2023-01-09: 80 mg via INTRAVENOUS

## 2023-01-09 MED ORDER — OXYCODONE HCL 5 MG PO TABS
5.0000 mg | ORAL_TABLET | Freq: Once | ORAL | Status: AC | PRN
  Administered 2023-01-09: 5 mg via ORAL
  Filled 2023-01-09: qty 1

## 2023-01-09 MED ORDER — CEFAZOLIN SODIUM-DEXTROSE 2-4 GM/100ML-% IV SOLN
INTRAVENOUS | Status: AC
  Filled 2023-01-09: qty 100

## 2023-01-09 MED ORDER — LACTATED RINGERS IV SOLN: INTRAVENOUS | Status: DC | PRN

## 2023-01-09 MED ORDER — ASPIRIN 81 MG PO TBEC: 81.0000 mg | DELAYED_RELEASE_TABLET | Freq: Two times a day (BID) | ORAL | 0 refills | Status: AC

## 2023-01-09 MED ORDER — OXYCODONE HCL 5 MG/5ML PO SOLN: 5.0000 mg | Freq: Once | ORAL | Status: AC | PRN

## 2023-01-09 MED ORDER — ROCURONIUM BROMIDE 100 MG/10ML IV SOLN
INTRAVENOUS | Status: DC | PRN
  Administered 2023-01-09: 70 mg via INTRAVENOUS

## 2023-01-09 MED ORDER — DEXAMETHASONE SODIUM PHOSPHATE 10 MG/ML IJ SOLN
INTRAMUSCULAR | Status: DC | PRN
  Administered 2023-01-09: 10 mg via INTRAVENOUS

## 2023-01-09 MED ORDER — CEFAZOLIN SODIUM-DEXTROSE 2-4 GM/100ML-% IV SOLN: 2.0000 g | INTRAVENOUS | Status: DC

## 2023-01-09 MED ORDER — ONDANSETRON HCL 4 MG/2ML IJ SOLN
INTRAMUSCULAR | Status: DC | PRN
  Administered 2023-01-09: 4 mg via INTRAVENOUS

## 2023-01-09 SURGICAL SUPPLY — 54 items
APL PRP STRL LF DISP 70% ISPRP (MISCELLANEOUS) ×1
BANDAGE ESMARK 4X12 BL STRL LF (DISPOSABLE) ×1 IMPLANT
BLADE SURG 15 STRL LF DISP TIS (BLADE) IMPLANT
BLADE SURG 15 STRL SS (BLADE) ×1
BLADE SURG SZ10 CARB STEEL (BLADE) ×1 IMPLANT
BNDG CMPR 12X4 ELC STRL LF (DISPOSABLE) ×1
BNDG CMPR STD VLCR NS LF 5.8X4 (GAUZE/BANDAGES/DRESSINGS) ×2
BNDG ELASTIC 4X5.8 VLCR NS LF (GAUZE/BANDAGES/DRESSINGS) ×1 IMPLANT
BNDG ESMARK 4X12 BLUE STRL LF (DISPOSABLE) ×1
CHLORAPREP W/TINT 26 (MISCELLANEOUS) ×1 IMPLANT
CLOTH BEACON ORANGE TIMEOUT ST (SAFETY) ×1 IMPLANT
COVER LIGHT HANDLE STERIS (MISCELLANEOUS) ×2 IMPLANT
CUFF TOURN SGL QUICK 18X4 (TOURNIQUET CUFF) ×1 IMPLANT
DRAPE HALF SHEET 40X57 (DRAPES) IMPLANT
DRAPE ORTHO 2.5IN SPLIT 77X108 (DRAPES) IMPLANT
DRAPE ORTHO SPLIT 77X108 STRL (DRAPES) ×1
ELECT REM PT RETURN 9FT ADLT (ELECTROSURGICAL) ×1
ELECTRODE REM PT RTRN 9FT ADLT (ELECTROSURGICAL) ×1 IMPLANT
GAUZE SPONGE 4X4 12PLY STRL (GAUZE/BANDAGES/DRESSINGS) ×1 IMPLANT
GLOVE BIO SURGEON STRL SZ7 (GLOVE) IMPLANT
GLOVE BIO SURGEON STRL SZ7.5 (GLOVE) IMPLANT
GLOVE BIO SURGEON STRL SZ8 (GLOVE) ×2 IMPLANT
GLOVE BIOGEL PI IND STRL 7.0 (GLOVE) ×2 IMPLANT
GLOVE BIOGEL PI IND STRL 7.5 (GLOVE) IMPLANT
GLOVE ECLIPSE 6.5 STRL STRAW (GLOVE) IMPLANT
GLOVE SRG 8 PF TXTR STRL LF DI (GLOVE) ×1 IMPLANT
GLOVE SURG UNDER POLY LF SZ8 (GLOVE) ×1
GOWN STRL REUS W/ TWL XL LVL3 (GOWN DISPOSABLE) ×1 IMPLANT
GOWN STRL REUS W/TWL LRG LVL3 (GOWN DISPOSABLE) ×2 IMPLANT
GOWN STRL REUS W/TWL XL LVL3 (GOWN DISPOSABLE) ×1
KIT TURNOVER KIT A (KITS) ×1 IMPLANT
MANIFOLD NEPTUNE II (INSTRUMENTS) ×1 IMPLANT
NDL HYPO 21X1.5 SAFETY (NEEDLE) ×1 IMPLANT
NEEDLE HYPO 21X1.5 SAFETY (NEEDLE) ×1 IMPLANT
NS IRRIG 1000ML POUR BTL (IV SOLUTION) ×1 IMPLANT
PACK BASIC LIMB (CUSTOM PROCEDURE TRAY) ×2 IMPLANT
PAD ARMBOARD 7.5X6 YLW CONV (MISCELLANEOUS) ×1 IMPLANT
PAD CAST 4YDX4 CTTN HI CHSV (CAST SUPPLIES) IMPLANT
PADDING CAST COTTON 4X4 STRL (CAST SUPPLIES) ×1
SET BASIN LINEN APH (SET/KITS/TRAYS/PACK) ×1 IMPLANT
SLING ARM FOAM STRAP XLG (SOFTGOODS) IMPLANT
SPLINT PLASTER CAST XFAST 5X30 (CAST SUPPLIES) IMPLANT
SPONGE T-LAP 18X18 ~~LOC~~+RFID (SPONGE) ×1 IMPLANT
STRIP CLOSURE SKIN 1/2X4 (GAUZE/BANDAGES/DRESSINGS) IMPLANT
SUT MNCRL AB 4-0 PS2 18 (SUTURE) IMPLANT
SUT MON AB 2-0 SH 27 (SUTURE) ×1
SUT MON AB 2-0 SH27 (SUTURE) ×1 IMPLANT
SYR 30ML LL (SYRINGE) ×1 IMPLANT
SYR BULB IRRIG 60ML STRL (SYRINGE) ×1 IMPLANT
SYS IMPL DISTAL BICEP BONE (Shoulder) ×1 IMPLANT
SYSTEM IMPL DISTAL BICEP BONE (Shoulder) IMPLANT
TAPE TRANSPORE STRL 2 31045 (GAUZE/BANDAGES/DRESSINGS) IMPLANT
TUBE SUCTION HIGH CAP CLEAR NV (SUCTIONS) IMPLANT
YANKAUER SUCT 12FT TUBE ARGYLE (SUCTIONS) IMPLANT

## 2023-01-09 NOTE — Anesthesia Procedure Notes (Signed)
Procedure Name: Intubation Date/Time: 01/09/2023 12:07 PM  Performed by: Marquis Buggy, CRNAPre-anesthesia Checklist: Patient identified, Emergency Drugs available, Suction available and Patient being monitored Patient Re-evaluated:Patient Re-evaluated prior to induction Oxygen Delivery Method: Circle system utilized Preoxygenation: Pre-oxygenation with 100% oxygen Induction Type: IV induction Ventilation: Mask ventilation without difficulty Laryngoscope Size: Mac and 4 Grade View: Grade I Tube type: Oral Tube size: 7.5 mm Number of attempts: 1 Airway Equipment and Method: Stylet Placement Confirmation: ETT inserted through vocal cords under direct vision Secured at: 23 cm Dental Injury: Teeth and Oropharynx as per pre-operative assessment

## 2023-01-09 NOTE — Transfer of Care (Signed)
Immediate Anesthesia Transfer of Care Note  Patient: Jesse Roberts  Procedure(s) Performed: DISTAL BICEPS TENDON REPAIR (Left: Arm Lower)  Patient Location: PACU  Anesthesia Type:General  Level of Consciousness: awake, alert , and oriented  Airway & Oxygen Therapy: Patient Spontanous Breathing and Patient connected to face mask oxygen  Post-op Assessment: Report given to RN, Post -op Vital signs reviewed and stable, and Patient moving all extremities  Post vital signs: stable  Last Vitals:  Vitals Value Taken Time  BP 145/105 01/09/23 1333  Temp    Pulse 88 01/09/23 1336  Resp 16 01/09/23 1336  SpO2 100 % 01/09/23 1336  Vitals shown include unvalidated device data.  Last Pain:  Vitals:   01/09/23 1033  TempSrc: Oral  PainSc: 6          Complications: No notable events documented.

## 2023-01-09 NOTE — Anesthesia Preprocedure Evaluation (Signed)
Anesthesia Evaluation  Patient identified by MRN, date of birth, ID band Patient awake    Reviewed: Allergy & Precautions, H&P , NPO status , Patient's Chart, lab work & pertinent test results, reviewed documented beta blocker date and time   Airway Mallampati: II  TM Distance: >3 FB Neck ROM: full    Dental no notable dental hx.    Pulmonary neg pulmonary ROS, sleep apnea    Pulmonary exam normal breath sounds clear to auscultation       Cardiovascular Exercise Tolerance: Good hypertension, negative cardio ROS  Rhythm:regular Rate:Normal     Neuro/Psych  PSYCHIATRIC DISORDERS Anxiety     negative neurological ROS  negative psych ROS   GI/Hepatic negative GI ROS, Neg liver ROS,,,  Endo/Other  negative endocrine ROSHypothyroidism    Renal/GU negative Renal ROS  negative genitourinary   Musculoskeletal   Abdominal   Peds  Hematology negative hematology ROS (+)   Anesthesia Other Findings   Reproductive/Obstetrics negative OB ROS                             Anesthesia Physical Anesthesia Plan  ASA: 2  Anesthesia Plan: General and General ETT   Post-op Pain Management:    Induction:   PONV Risk Score and Plan:   Airway Management Planned:   Additional Equipment:   Intra-op Plan:   Post-operative Plan:   Informed Consent: I have reviewed the patients History and Physical, chart, labs and discussed the procedure including the risks, benefits and alternatives for the proposed anesthesia with the patient or authorized representative who has indicated his/her understanding and acceptance.     Dental Advisory Given  Plan Discussed with: CRNA  Anesthesia Plan Comments:        Anesthesia Quick Evaluation

## 2023-01-09 NOTE — Op Note (Signed)
Orthopaedic Surgery Operative Note (CSN: [id number])  Jesse Roberts  23-Oct-1960 Date of Surgery: 01/09/2023   Diagnoses:  Left distal biceps tendon rupture  Procedure: Very  Left distal biceps tendon rupture   Operative Finding Successful completion of the planned procedure.  Biceps tendon stump was identified, and secured with a strong stitch.  This was then passed through a unicortical hole through the tuberosity, and secured with the button.   Post-Op Diagnosis: Same Surgeons:Primary: Oliver Barre, MD Assistants: Cecile Sheerer Location: [facility] OR ROOM 4 Anesthesia: General Antibiotics: Ancef 2 g Tourniquet time:  Total Tourniquet Time Documented: Upper Arm (Left) - 73 minutes Total: Upper Arm (Left) - 73 minutes  Estimated Blood Loss: 30 cc Complications: None Specimens: None  Implants: Implant Name Type Inv. Item Serial No. Manufacturer Lot No. LRB No. Used Action  SYS IMPL DISTAL BICEP BONE - [id number] Shoulder SYS IMPL DISTAL BICEP BONE  [employer] [id number] Left 1 Implanted    Indications for Surgery:   Jesse Roberts is a 63 y.o. male who sustained a left distal biceps tendon rupture.  In order to restore his form and function, I recommended operative repair.  The procedure was discussed in detail.  All questions were answered.  In more detail, benefits and risks of operative and nonoperative management were discussed prior to surgery with the patient and informed consent form was completed.  Specific risks including infection, need for additional surgery, failure of repair, loss of strength, stiffness, loss of range of motion, weakness, persistent pain, numbness, damage to surrounding structures including nerves and blood vessels, blood clots and more severe complications associate with anesthesia.  He agreed, and elected to proceed.  Surgical consent was finalized.   Procedure:   The patient was identified properly. Informed consent was obtained and the surgical site was  marked. The patient was taken to the OR where general anesthesia was induced.  The patient was positioned supine, with his arm on a hand table.  The left arm was prepped and draped in the usual sterile fashion.  Timeout was performed before the beginning of the case.  A sterile tourniquet was used for the above duration.  He received 2 g of Ancef prior to making incision.  The tourniquet was inflated.  We made a transverse incision, approximately 3-4 cm distal to the antecubital fossa.  We incised sharply through skin, then dissected bluntly.  The lateral antebrachial nerve was not encountered.  We then turned our attention to the distal biceps tendon.  It had retracted into the distal upper arm.  We dissected bluntly, and were able to find the muscle belly.  We then encountered a seroma, which helped [place] to identify the distal stump of the tendon.  It was then removed from our wound, and it was obvious that the tendon had been ruptured and injured.  There was some delamination of the tissue.  The most distal extent of the tendon was very bulbous.  This was trimmed back to a smaller tendon.  We then used a fiber link suture, to whipstitch the tendon, from proximal to distal.  The ends of the suture were then passed out the distal extent of the stump.  The stump was then allowed to retract into the wound bed.  We turned our attention to identifying the radial tuberosity.  We inserted multiple retractors, to expose the radial tuberosity.  It was then cleared off with a key elevator.  We then placed a guidepin in bicortical fashion  directly to the radial tuberosity.  We then used a 6.5 reamer over top this guidepin to ream unicortical.  At this point, we had a socket for the distal stump of the biceps tendon, as well as a path that we are able to pass the button in order to secure tendon.  The limbs of the suture were then passed through a button, and we confirmed that we are able to slide the button on the  sutures.  Once again, we turned our attention back to the radial tuberosity, weight excellent visibility.  The button was then passed through the proximal radius, and flipped on the dorsal aspect of the radial shaft, in line with the radial tuberosity.  We sequentially tensioned the limbs of the suture, and were able to visualize the distal aspect of the biceps tendon passed into the socket that was created with a reamer.  The arm was flexed to approximately 30 degrees, we continued to tension the biceps tendon within the socket.  We then took 1 limb of the suture, passed through the biceps tendon, and this was tied to secure it into place.  We gently range the elbow, and we are satisfied with our repair, as well as the position of the biceps tendon.  We irrigated the wound copiously.  We closed the incision in a multilayer fashion with absorbable suture.  Sterile dressing was placed followed by posterior slab splint.  Patient was awoken taken to PACU in stable condition.   Post-operative plan:  The patient will be NWB on the operative extremity Discharge home from the PACU once they have recovered DVT prophylaxis Aspirin 81 mg twice daily for 6 weeks.    Pain control with PRN pain medication preferring oral medicines.   Follow up plan will be scheduled in approximately 14 days for incision check and XR.

## 2023-01-09 NOTE — Discharge Instructions (Signed)
Pantelis Elgersma A. Dallas Schimke, MD [facility] [address] [place],  [place]  [address] Phone: [phone] Fax: [phone]   POST-OPERATIVE INSTRUCTIONS - UPPER EXTREMITY   WOUND CARE Please keep splint clean dry and intact until followup.  You may shower on Post-Op Day #2.  You must keep splint dry during this process and may find that a plastic bag taped around the arm or alternatively a towel based bath may be a better option.   If you get your splint wet or if it is damaged please contact our clinic.  EXERCISES Due to your splint being in place you will not be able to bear weight through your extremity.   DO NOT PUT ANY WEIGHT ON YOUR OPERATIVE ARM   REGIONAL ANESTHESIA (NERVE BLOCKS) The anesthesia team may have performed a nerve block for you if safe in the setting of your care.  This is a great tool used to minimize pain.  Typically the block may start wearing off overnight but the long acting medicine may last for 3-4 days.  The nerve block wearing off can be a challenging period but please utilize your as needed pain medications to try and manage this period.    POST-OP MEDICATIONS- Multimodal approach to pain control  In general your pain will be controlled with a combination of substances.  Prescriptions unless otherwise discussed are electronically sent to your pharmacy.  This is a carefully made plan we use to minimize narcotic use.     - Celebrex - Anti-inflammatory medication taken on a scheduled basis  - Acetaminophen - Non-narcotic pain medicine taken on a scheduled basis   - Oxycodone - This is a strong narcotic, to be used only on an "as needed" basis for pain.  -  Aspirin 81mg  - This medicine is used to minimize the risk of blood clots after surgery.             -          Zofran - take as needed for nausea   FOLLOW-UP If you develop a Fever (>101.5), Redness or Drainage from the surgical incision site, please call our office to arrange for  an evaluation. Please call the office to schedule a follow-up appointment for your incision check if you do not already have one, 10-14 days post-operatively.  IF YOU HAVE ANY QUESTIONS, PLEASE FEEL FREE TO CALL OUR OFFICE.  HELPFUL INFORMATION  If you had a block, it will wear off between 8-24 hrs postop typically.  This is period when your pain may go from nearly zero to the pain you would have had postop without the block.  This is an abrupt transition but nothing dangerous is happening.  You may take an extra dose of narcotic when this happens.  You should wean off your narcotic medicines as soon as you are able.  Most patients will be off or using minimal narcotics before their first postop appointment.   Elevating your leg will help with swelling and pain control.  You are encouraged to elevate your leg as much as possible in the first couple of weeks following surgery.  Imagine a drop of water on your toe, and your goal is to get that water back to your heart.  We suggest you use the pain medication the first night prior to going to bed, in order to ease any pain when the anesthesia wears off. You should avoid taking pain medications on an empty stomach as it  will make you nauseous.  Do not drink alcoholic beverages or take illicit drugs when taking pain medications.  In most states it is against the law to drive while you are in a splint or sling.  And certainly against the law to drive while taking narcotics.  You may return to work/school in the next couple of days when you feel up to it.   Pain medication may make you constipated.  Below are a few solutions to try in this order: Decrease the amount of pain medication if you aren't having pain. Drink lots of decaffeinated fluids. Drink prune juice and/or each dried prunes  If the first 3 don't work start with additional solutions Take Colace - an over-the-counter stool softener Take Senokot - an over-the-counter laxative Take  Miralax - a stronger over-the-counter laxative

## 2023-01-09 NOTE — Interval H&P Note (Signed)
History and Physical Interval Note:  01/09/2023 11:16 AM  Jesse Roberts  has presented today for surgery, with the diagnosis of Left biceps tendon rupture.  The various methods of treatment have been discussed with the patient and family. After consideration of risks, benefits and other options for treatment, the patient has consented to  Procedure(s): DISTAL BICEPS TENDON REPAIR (Left) as a surgical intervention.  The patient's history has been reviewed, patient examined, no change in status, stable for surgery.  I have reviewed the patient's chart and labs.  Questions were answered to the patient's satisfaction.     Oliver Barre

## 2023-01-11 NOTE — Anesthesia Postprocedure Evaluation (Signed)
Anesthesia Post Note  Patient: Jesse Roberts  Procedure(s) Performed: DISTAL BICEPS TENDON REPAIR (Left: Arm Lower)  Patient location during evaluation: Phase II Anesthesia Type: General Level of consciousness: awake Pain management: pain level controlled Vital Signs Assessment: post-procedure vital signs reviewed and stable Respiratory status: spontaneous breathing and respiratory function stable Cardiovascular status: blood pressure returned to baseline and stable Postop Assessment: no headache and no apparent nausea or vomiting Anesthetic complications: no Comments: Late entry   No notable events documented.   Last Vitals:  Vitals:   01/09/23 1430 01/09/23 1441  BP: (!) 135/100 (!) 132/94  Pulse: 77 75  Resp: 10   Temp:  36.5 C  SpO2: 100%     Last Pain:  Vitals:   01/09/23 1444  TempSrc:   PainSc: 8                  Windell Norfolk

## 2023-01-12 ENCOUNTER — Encounter (HOSPITAL_COMMUNITY): Payer: Self-pay | Admitting: Orthopedic Surgery

## 2023-01-13 ENCOUNTER — Telehealth: Payer: Self-pay | Admitting: Orthopedic Surgery

## 2023-01-13 NOTE — Telephone Encounter (Signed)
Patient stopped by office with wet posterior splint Dr Dallas Schimke removed splint, checked incision and I put him in a new posterior splint. He was advised to let us know if he has any further problems

## 2023-01-20 ENCOUNTER — Ambulatory Visit: Admitting: Orthopedic Surgery

## 2023-01-24 ENCOUNTER — Ambulatory Visit: Admitting: Orthopedic Surgery

## 2023-01-31 ENCOUNTER — Encounter: Payer: Self-pay | Admitting: Orthopedic Surgery

## 2023-01-31 ENCOUNTER — Ambulatory Visit (INDEPENDENT_AMBULATORY_CARE_PROVIDER_SITE_OTHER): Payer: Worker's Compensation | Admitting: Orthopedic Surgery

## 2023-01-31 VITALS — BP 152/84 | HR 89 | Ht 67.0 in | Wt 200.3 lb

## 2023-01-31 DIAGNOSIS — S46212D Strain of muscle, fascia and tendon of other parts of biceps, left arm, subsequent encounter: Secondary | ICD-10-CM

## 2023-01-31 NOTE — Patient Instructions (Signed)
Note for work, ok to return to light duty

## 2023-01-31 NOTE — Progress Notes (Signed)
Orthopaedic Postop Note  Assessment: Jesse Roberts is a 62 y.o. male s/p left distal biceps tendon repair  DOS: 01/09/2023.    Plan: Mr. Redner is doing well following his procedure.  He return to clinic without his splint on.  His sutures were already trimmed.  Incision is healing well.  Small amount of numbness to the dorsal aspect of the left thumb.  He is already started working on range of motion.  He tolerates extension to 30 degrees.  He is lacking approximately 30 degrees of supination.  I recommended he wear a brace on his left arm to limit his motion, but he has asked not to wear the brace.  He states he will be careful.  I informed him that he is at increased risk for a rerupture or poor healing following his surgery.  He states understanding.  He would like to go without the brace.  He is asked to return to light duty at work.  We have provided him with documentation.  He should focus on range of motion only.  No lifting for the next 3 weeks.  Follow-up: Return in about 3 weeks (around 02/21/2023). XR at next visit: None  Subjective:  Chief Complaint  Patient presents with   Routine Post Op    L arm DOS 01/09/23    History of Present Illness: Jesse Roberts is a 62 y.o. male who presents following the above stated procedure.  Surgery was approximately 3 weeks ago.  He did return to clinic a few days after surgery to have a splint changed.  However, since then, he has traveled across country to help family move.  He is doing well.  He stopped taking oxycodone approximately 3 days after surgery.  He has a little bit of numbness to the dorsal aspect of the left thumb.  He recently remove the splint.  He has been working on range of motion.  No pain.  He feels good.  Review of Systems: No fevers or chills Numbness to left thumb No Chest Pain No shortness of breath   Objective: BP (!) 152/84   Pulse 89   Ht 5\' 7"  (1.702 m)   Wt 200 lb 4.8 oz (90.9 kg)   BMI 31.37 kg/m   Physical  Exam:  Alert and oriented.  No acute distress.  Volar forearm incision is healing.  No surrounding erythema or drainage.  Biceps tendon is palpable.  He has some tenderness within the antecubital fossa.  Range of motion from 30-130.  He is lacking approximately 30 degrees of supination.  Fingers are warm and well-perfused.  Decreased sensation to the dorsal aspect of the left thumb.  IMAGING: I personally ordered and reviewed the following images:  No new imaging obtained today.  Oliver Barre, MD 01/31/2023 8:58 AM

## 2023-02-03 ENCOUNTER — Ambulatory Visit: Admitting: Orthopedic Surgery

## 2023-02-21 ENCOUNTER — Ambulatory Visit: Admitting: Orthopedic Surgery

## 2023-03-17 ENCOUNTER — Ambulatory Visit: Payer: Worker's Compensation | Admitting: Orthopedic Surgery

## 2023-03-17 ENCOUNTER — Encounter: Payer: Self-pay | Admitting: Orthopedic Surgery

## 2023-03-17 VITALS — BP 127/88 | HR 75 | Ht 67.0 in | Wt 203.5 lb

## 2023-03-17 DIAGNOSIS — S46212D Strain of muscle, fascia and tendon of other parts of biceps, left arm, subsequent encounter: Secondary | ICD-10-CM

## 2023-03-17 NOTE — Progress Notes (Signed)
Orthopaedic Postop Note  Assessment: Jesse Roberts is a 62 y.o. male s/p left distal biceps tendon repair  DOS: 01/09/2023.    Plan: Jesse Roberts has done very well in his recovery.  He has near full range of motion.  He has atrophy of the left biceps, but has not started with strengthening activities.  At this point, I have allowed him to start working on gentle strengthening.  He is aware that he should start very slow.  No issues with the surgical incisions.  If he has any further issues, he will contact the clinic.  Otherwise, follow-up as needed.  Follow-up: Return if symptoms worsen or fail to improve. XR at next visit: None  Subjective:  Chief Complaint  Patient presents with   Arm Injury    L/ doing good.    History of Present Illness: Jesse Roberts is a 62 y.o. male who presents following the above stated procedure.  Surgery was approximately 2 months ago.  He has done very well.  Infrequent pain in the posterior aspect of the left elbow.  His range of motion is good.  He is lacking just a little bit of motion.  He notes a mild deformity to the left biceps, and wanted to ensure that this was nothing serious.  He recently lifted some very light weights, and felt good overall.    Review of Systems: No fevers or chills No numbness No Chest Pain No shortness of breath   Objective: BP 127/88   Pulse 75   Ht 5\' 7"  (1.702 m)   Wt 203 lb 8 oz (92.3 kg)   BMI 31.87 kg/m   Physical Exam:  Alert and oriented.  No acute distress.  Surgical incisions healed.  No surrounding erythema or drainage.  Biceps tendon is palpable within the antecubital fossa.  Range of motion is near full, lacking just a little bit of supination.  He has atrophy of the left biceps.  Fingers are warm and well-perfused.  Sensation intact to the left hand.  IMAGING: I personally ordered and reviewed the following images:  No new imaging obtained today.  Oliver Barre, MD 03/17/2023 9:13 AM
